# Patient Record
Sex: Male | Born: 1995 | Race: White | Hispanic: No | Marital: Single | Smoking: Never smoker
Health system: Southern US, Community
[De-identification: ages and names within clinical notes are randomized; demographics above are authoritative.]

---
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
## 2014-12-28 ENCOUNTER — Emergency Department: Payer: Medicaid Other

## 2014-12-28 ENCOUNTER — Encounter: Payer: Self-pay | Admitting: Emergency Medicine

## 2014-12-28 ENCOUNTER — Emergency Department
Admission: EM | Admit: 2014-12-28 | Discharge: 2014-12-28 | Disposition: A | Discharge status: Home / Self Care | Payer: Self-pay | Attending: Emergency Medicine | Admitting: Emergency Medicine

## 2014-12-28 DIAGNOSIS — T22011A Burn of unspecified degree of right forearm, initial encounter: Secondary | ICD-10-CM | POA: Insufficient documentation

## 2014-12-28 DIAGNOSIS — S299XXA Unspecified injury of thorax, initial encounter: Secondary | ICD-10-CM | POA: Insufficient documentation

## 2014-12-28 DIAGNOSIS — Y998 Other external cause status: Secondary | ICD-10-CM | POA: Insufficient documentation

## 2014-12-28 DIAGNOSIS — T22012A Burn of unspecified degree of left forearm, initial encounter: Secondary | ICD-10-CM | POA: Insufficient documentation

## 2014-12-28 DIAGNOSIS — Y92410 Unspecified street and highway as the place of occurrence of the external cause: Secondary | ICD-10-CM | POA: Insufficient documentation

## 2014-12-28 DIAGNOSIS — Y9389 Activity, other specified: Secondary | ICD-10-CM | POA: Insufficient documentation

## 2014-12-28 MED ORDER — IBUPROFEN 800 MG PO TABS: 800.0000 mg | ORAL_TABLET | Freq: Three times a day (TID) | ORAL | Status: DC | PRN

## 2014-12-28 MED ORDER — BACLOFEN 10 MG PO TABS
10.0000 mg | ORAL_TABLET | Freq: Three times a day (TID) | ORAL | Status: DC
Start: 2014-12-28 — End: 2016-02-08
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
## 2014-12-28 NOTE — Discharge Instructions (Signed)

## 2016-01-24 ENCOUNTER — Emergency Department: Payer: Medicaid Other

## 2016-01-24 ENCOUNTER — Encounter: Payer: Self-pay | Admitting: Emergency Medicine

## 2016-01-24 DIAGNOSIS — Z79899 Other long term (current) drug therapy: Secondary | ICD-10-CM | POA: Insufficient documentation

## 2016-01-24 DIAGNOSIS — K219 Gastro-esophageal reflux disease without esophagitis: Secondary | ICD-10-CM | POA: Insufficient documentation

## 2016-01-24 LAB — CBC
HEMATOCRIT: 43.1 % (ref 40.0–52.0)
Hemoglobin: 14.8 g/dL (ref 13.0–18.0)
MCH: 28.9 pg (ref 26.0–34.0)
MCHC: 34.4 g/dL (ref 32.0–36.0)
MCV: 84.2 fL (ref 80.0–100.0)
PLATELETS: 190 10*3/uL (ref 150–440)
RBC: 5.12 MIL/uL (ref 4.40–5.90)
RDW: 13.1 % (ref 11.5–14.5)
WBC: 13.3 10*3/uL — AB (ref 3.8–10.6)

## 2016-01-24 LAB — BASIC METABOLIC PANEL
Anion gap: 9 (ref 5–15)
BUN: 13 mg/dL (ref 6–20)
CHLORIDE: 106 mmol/L (ref 101–111)
CO2: 24 mmol/L (ref 22–32)
CREATININE: 0.79 mg/dL (ref 0.61–1.24)
Calcium: 9.3 mg/dL (ref 8.9–10.3)
Glucose, Bld: 92 mg/dL (ref 65–99)
POTASSIUM: 3.4 mmol/L — AB (ref 3.5–5.1)
SODIUM: 139 mmol/L (ref 135–145)

## 2016-01-24 LAB — TROPONIN I: Troponin I: <0.03 ng/mL (ref <0.03)

## 2016-01-24 NOTE — ED Triage Notes (Addendum)
Pt to triage by wheelchair due to chest pain that started around 1900, denies other symptoms. Pt reports central chest pain, sts pain feels like "a ton of bricks sitting on chest." Pt denies N/V. Pt currently laughing and talking with triage tech during blood draw.

## 2016-01-25 ENCOUNTER — Emergency Department
Admission: EM | Admit: 2016-01-25 | Discharge: 2016-01-25 | Disposition: A | Discharge status: Home / Self Care | Payer: Medicaid Other | Attending: Emergency Medicine | Admitting: Emergency Medicine

## 2016-01-25 DIAGNOSIS — R079 Chest pain, unspecified: Secondary | ICD-10-CM

## 2016-01-25 DIAGNOSIS — K219 Gastro-esophageal reflux disease without esophagitis: Secondary | ICD-10-CM

## 2016-01-25 MED ORDER — GI COCKTAIL ~~LOC~~
30.0000 mL | ORAL | Status: AC
  Administered 2016-01-25: 30 mL via ORAL
  Filled 2016-01-25: qty 30

## 2016-01-25 MED ORDER — ALUMINUM-MAGNESIUM-SIMETHICONE 200-200-20 MG/5ML PO SUSP: 30.0000 mL | Freq: Three times a day (TID) | ORAL | 0 refills | Status: DC

## 2016-01-25 MED ORDER — FAMOTIDINE 20 MG PO TABS: 20.0000 mg | ORAL_TABLET | Freq: Two times a day (BID) | ORAL | 0 refills | Status: DC

## 2016-01-25 MED ORDER — FAMOTIDINE 20 MG PO TABS
40.0000 mg | ORAL_TABLET | Freq: Once | ORAL | Status: AC
  Administered 2016-01-25: 40 mg via ORAL
  Filled 2016-01-25: qty 2

## 2016-01-25 NOTE — ED Notes (Signed)
Pt. States intermittent chest pain Wednesday, worse at around 7 pm.  Pt. States taking aspirin when first experienced chest pain today, Pt. Unsure dosage.

## 2016-01-25 NOTE — ED Notes (Signed)
Pt. Going home with father. 

## 2016-01-25 NOTE — ED Provider Notes (Signed)
[facility] Emergency Department Provider Note  ____________________________________________  Time seen: Approximately 1:06 AM  I have reviewed the triage vital signs and the nursing notes.   HISTORY  Chief Complaint Chest Pain    HPI Charles Henderson is a 20 y.o. male who complains of chest pain and multiple episodes today. First started around 11 AM, very mild, resolved on its own. Then, after eating 2 slices of [employer] pizza around 6:30 PM, he suddenly had a recurrence of the pain. It is in center of his chest,initially it felt like "I want to punch someone," and now has improved to feeling like "what the frick" according to the patient. This is the best he can describe it.  Nonradiating. Not exertional, not pleuritic. No associated shortness of breath dizziness diaphoresis or vomiting. No other aggravating or alleviating factors. Moderate in intensity and worse, now mild.     History reviewed. No pertinent past medical history.   There are no active problems to display for this patient.    History reviewed. No pertinent surgical history.   Prior to Admission medications   Medication Sig Start Date End Date Taking? Authorizing Provider  aluminum-magnesium hydroxide-simethicone (MAALOX) 200-200-20 MG/5ML SUSP Take 30 mLs by mouth 4 (four) times daily -  before meals and at bedtime. 01/25/16   Sharman CheekPhillip Zeah Germano, MD  baclofen (LIORESAL) 10 MG tablet Take 1 tablet (10 mg total) by mouth 3 (three) times daily. 12/28/14   Charmayne Sheerharles M Beers, PA-C  famotidine (PEPCID) 20 MG tablet Take 1 tablet (20 mg total) by mouth 2 (two) times daily. 01/25/16   Sharman CheekPhillip Shamecca Whitebread, MD  ibuprofen (ADVIL,MOTRIN) 800 MG tablet Take 1 tablet (800 mg total) by mouth every 8 (eight) hours as needed. 12/28/14   Evangeline Dakinharles M Beers, PA-C     Allergies Sulfa antibiotics   History reviewed. No pertinent family history.  Social History Social History  Substance Use Topics  . Smoking  status: Never Smoker  . Smokeless tobacco: Never Used  . Alcohol use Yes    Review of Systems  Constitutional:   No fever or chills.  ENT:   No sore throat. No rhinorrhea. Cardiovascular:   Positive as above chest pain. Respiratory:   No dyspnea or cough. Gastrointestinal:   Negative for abdominal pain, vomiting and diarrhea.  Genitourinary:   Negative for dysuria or difficulty urinating. Musculoskeletal:   Negative for focal pain or swelling Neurological:   Negative for headaches 10-point ROS otherwise negative.  ____________________________________________   PHYSICAL EXAM:  VITAL SIGNS: ED Triage Vitals [01/24/16 2258]  Enc Vitals Group     BP (!) 141/81     Pulse Rate 89     Resp 15     Temp 98 F (36.7 C)     Temp Source Oral     SpO2 100 %     Weight 250 lb (113.4 kg)     Height 5\' 9"  (1.753 m)     Head Circumference      Peak Flow      Pain Score 9     Pain Loc      Pain Edu?      Excl. in GC?     Vital signs reviewed, nursing assessments reviewed.   Constitutional:   Alert and oriented. Well appearing and in no distress. Eyes:   No scleral icterus. No conjunctival pallor. PERRL. EOMI.  No nystagmus. ENT   Head:   Normocephalic and atraumatic.   Nose:   No congestion/rhinnorhea.  No septal hematoma   Mouth/Throat:   MMM, no pharyngeal erythema. No peritonsillar mass.    Neck:   No stridor. No SubQ emphysema. No meningismus. Hematological/Lymphatic/Immunilogical:   No cervical lymphadenopathy. Cardiovascular:   RRR. Symmetric bilateral radial and DP pulses.  No murmurs.  Respiratory:   Normal respiratory effort without tachypnea nor retractions. Breath sounds are clear and equal bilaterally. No wheezes/rales/rhonchi.Positive mild chest wall tenderness over the sternum in the area of indicated pain Gastrointestinal:   Soft and nontender. Non distended. There is no CVA tenderness.  No rebound, rigidity, or guarding. Genitourinary:    deferred Musculoskeletal:   Nontender with normal range of motion in all extremities. No joint effusions.  No lower extremity tenderness.  No edema. Neurologic:   Normal speech and language.  CN 2-10 normal. Motor grossly intact. No gross focal neurologic deficits are appreciated.  Skin:    Skin is warm, dry and intact. No rash noted.  No petechiae, purpura, or bullae.  ____________________________________________    LABS (pertinent positives/negatives) (all labs ordered are listed, but only abnormal results are displayed) Labs Reviewed  BASIC METABOLIC PANEL - Abnormal; Notable for the following:       Result Value   Potassium 3.4 (*)    All other components within normal limits  CBC - Abnormal; Notable for the following:    WBC 13.3 (*)    All other components within normal limits  TROPONIN I   ____________________________________________   EKG  Interpreted by me Normal sinus rhythm rate of 85, normal axis intervals QRS ST segments and T waves.  ____________________________________________    RADIOLOGY  Chest x-ray unremarkable  ____________________________________________   PROCEDURES Procedures  ____________________________________________   INITIAL IMPRESSION / ASSESSMENT AND PLAN / ED COURSE  Pertinent labs & imaging results that were available during my care of the patient were reviewed by me and considered in my medical decision making (see chart for details).  Patient presents with chest pain, very nonspecific and atypical.Considering the patient's symptoms, medical history, and physical examination today, I have low suspicion for cholecystitis or biliary pathology, pancreatitis, perforation or bowel obstruction, hernia, intra-abdominal abscess, AAA or dissection, volvulus or intussusception, mesenteric ischemia, or appendicitis.  By history very likely to be acid reflux, but exam suggests chest wall pain as well. Either way, appears to be a benign  etiology. I'll give a trial of antacids, recommend follow-up with his primary care doctor.  He confirms that he does have a primary care doctor and can call them tomorrow.    Clinical Course    ____________________________________________   FINAL CLINICAL IMPRESSION(S) / ED DIAGNOSES  Final diagnoses:  Nonspecific chest pain  Gastroesophageal reflux disease, esophagitis presence not specified       Portions of this note were generated with dragon dictation software. Dictation errors may occur despite best attempts at proofreading.    Sharman CheekPhillip Vesta Wheeland, MD 01/25/16 0110

## 2016-02-08 ENCOUNTER — Emergency Department
Admission: EM | Admit: 2016-02-08 | Discharge: 2016-02-08 | Disposition: A | Discharge status: Home / Self Care | Payer: Self-pay | Attending: Emergency Medicine | Admitting: Emergency Medicine

## 2016-02-08 ENCOUNTER — Encounter: Payer: Self-pay | Admitting: Emergency Medicine

## 2016-02-08 ENCOUNTER — Emergency Department: Payer: Self-pay

## 2016-02-08 DIAGNOSIS — J069 Acute upper respiratory infection, unspecified: Secondary | ICD-10-CM | POA: Insufficient documentation

## 2016-02-08 DIAGNOSIS — B9789 Other viral agents as the cause of diseases classified elsewhere: Secondary | ICD-10-CM

## 2016-02-08 MED ORDER — PROMETHAZINE-DM 6.25-15 MG/5ML PO SYRP: 5.0000 mL | ORAL_SOLUTION | Freq: Four times a day (QID) | ORAL | 0 refills | Status: DC | PRN

## 2016-02-08 NOTE — ED Triage Notes (Signed)
Patient to ER for c/o sore throat with coughing up blood tinged mucous this am. Patient in no acute distress, color of skin WNL for ethnicity

## 2016-02-08 NOTE — ED Notes (Signed)
See triage note  States he developed sore throat with cough about 3 days .Marland Kitchen. Also has had fever   But noticed some blood tinged sputum after coughing this am

## 2016-02-08 NOTE — ED Provider Notes (Signed)
[facility] Emergency Department Provider Note  ____________________________________________  Time seen: Approximately 11:52 AM  I have reviewed the triage vital signs and the nursing notes.   HISTORY  Chief Complaint Cough and Sore Throat    HPI Charles Henderson is a 20 y.o. male , NAD, presents to the emergency department for evaluation of productive cough with blood-tinged sputum. States he blew his nose at 7:30 this morning and noticed blood in the mucus. Approximately 2 hours ago he had a cough that was productive of mucus that was also blood-tinged. Had sensation of shortness of breath with the cough but that has resolved. Denies any chest pain, wheezing or palpitations or sensation of racing heart. Has had no abdominal pain, nausea or vomiting. Denies headache, visual changes, numbness, weakness, tingling. Denies any injuries or traumas to cause his symptoms. States he quit smoking tobacco products approximately 2 weeks ago and notes he had only been using tobacco products approximately 6 weeks prior to that. Was seen by his primary care provider yesterday for sore throat and had negative strep testing and diagnosed with viral syndrome. States he continues to have throat irritation but has been taking over-the-counter TheraFlu and Tylenol cold medication which seemed to help for short periods of time. Has had no difficulty swallowing, eating or drinking. Last dose was earlier this morning.    History reviewed. No pertinent past medical history.  There are no active problems to display for this patient.   History reviewed. No pertinent surgical history.  Prior to Admission medications   Medication Sig Start Date End Date Taking? Authorizing Provider  promethazine-dextromethorphan (PROMETHAZINE-DM) 6.25-15 MG/5ML syrup Take 5 mLs by mouth 4 (four) times daily as needed for cough. 02/08/16   Jami L Hagler, PA-C    Allergies Sulfa antibiotics  No family  history on file.  Social History Social History  Substance Use Topics  . Smoking status: Never Smoker  . Smokeless tobacco: Never Used  . Alcohol use Yes     Review of Systems  Constitutional: No fever/chills Eyes: No visual changes. No discharge ENT: Positive sore throat, nasal congestion. No ear pain, sinus pressure. Cardiovascular: No chest pain, Palpitations or racing heart. Respiratory: Positive productive cough with sensation of shortness of breath with cough only. No wheezing.  Gastrointestinal: No abdominal pain.  No nausea, vomiting.   Musculoskeletal: Negative for general myalgias.  Skin: Negative for rash. Neurological: Negative for headaches, focal weakness or numbness. He tingling. 10-point ROS otherwise negative.  ____________________________________________   PHYSICAL EXAM:  VITAL SIGNS: ED Triage Vitals [02/08/16 1121]  Enc Vitals Group     BP (!) 161/85     Pulse Rate 93     Resp 20     Temp 98.3 F (36.8 C)     Temp Source Oral     SpO2 97 %     Weight      Height      Head Circumference      Peak Flow      Pain Score      Pain Loc      Pain Edu?      Excl. in GC?      Constitutional: Alert and oriented. Well appearing and in no acute distress. Eyes: Conjunctivae are normal Without icterus, injection or discharge. Head: Atraumatic. ENT:      Ears: TMs visualized bilaterally without effusion, erythema, bulging or perforation.      Nose: Mild bilateral congestion with clear rhinorrhea and turbinates  are injected. No epistaxis.      Mouth/Throat: Mucous membranes are moist. Pharynx with mild injection but no swelling or exudates. Uvula is midline. Airway is patent. Clear postnasal drip. Neck: No stridor. Supple with full range of motion. Hematological/Lymphatic/Immunilogical: No cervical lymphadenopathy. Cardiovascular: Normal rate, regular rhythm. Normal S1 and S2. No murmurs, rubs, gallops. Good peripheral circulation. Respiratory: Normal  respiratory effort without tachypnea or retractions. Lungs CTAB With breath sounds noted in all lung fields. No wheeze, rhonchi, rales. Musculoskeletal: No lower extremity tenderness nor edema.  No joint effusions. Full range of motion of bilateral upper and lower extremities without pain or difficulty. Neurologic:  Normal speech and language. Normal gait and posture. No gross focal neurologic deficits are appreciated.  Skin:  Skin is warm, dry and intact. No rash noted. Psychiatric: Mood and affect are normal. Speech and behavior are normal. Patient exhibits appropriate insight and judgement.   ____________________________________________   LABS  None ____________________________________________  EKG  None ____________________________________________  RADIOLOGY I, Hope PigeonJami L Hagler, personally viewed and evaluated these images (plain radiographs) as part of my medical decision making, as well as reviewing the written report by the radiologist.  Dg Chest 2 View  Result Date: 02/08/2016 CLINICAL DATA:  Patient reports productive cough with clear sputum and fever x2-3 days. Onset of blood in sputum today. No known heart or lung conditions. Former smoker. EXAM: CHEST  2 VIEW COMPARISON:  01/24/2016 FINDINGS: Midline trachea.  Normal heart size and mediastinal contours. Sharp costophrenic angles.  No pneumothorax.  Clear lungs. IMPRESSION: No active cardiopulmonary disease. Electronically Signed   By: Jeronimo GreavesKyle  Talbot M.D.   On: 02/08/2016 12:46    ____________________________________________    PROCEDURES  Procedure(s) performed: None   Procedures   Medications - No data to display   ____________________________________________   INITIAL IMPRESSION / ASSESSMENT AND PLAN / ED COURSE  Pertinent labs & imaging results that were available during my care of the patient were reviewed by me and considered in my medical decision making (see chart for details).  Clinical Course      Patient's diagnosis is consistent with Viral URI with cough. Episode of blood-tinged sputum was more than likely sputum came from the sinus tract. Patient is well-appearing without tachypnea or tachycardia. No overt hemoptysis has occurred and patient has had no cough, sputum production, epistaxis or hemoptysis while being in the emergency department. He's had no chest pain, shortness of breath during his ED course. Chest x-ray is without abnormality. Patient will be discharged home with prescriptions for Promethazine DM to take as directed. May continue over-the-counter Tylenol or ibuprofen as needed. Patient encouraged to complete warm salt water gargles as often as needed. Patient is to follow up with his primary care provider if symptoms persist past this treatment course. Patient is given ED precautions to return to the ED for any worsening or new symptoms.    ____________________________________________  FINAL CLINICAL IMPRESSION(S) / ED DIAGNOSES  Final diagnoses:  Viral URI with cough      NEW MEDICATIONS STARTED DURING THIS VISIT:  New Prescriptions   PROMETHAZINE-DEXTROMETHORPHAN (PROMETHAZINE-DM) 6.25-15 MG/5ML SYRUP    Take 5 mLs by mouth 4 (four) times daily as needed for cough.         Hope PigeonJami L Hagler, PA-C 02/08/16 1307    Nita Sicklearolina Veronese, MD 02/09/16 [phone]

## 2017-04-17 ENCOUNTER — Emergency Department: Payer: BLUE CROSS/BLUE SHIELD

## 2017-04-17 ENCOUNTER — Emergency Department
Admission: EM | Admit: 2017-04-17 | Discharge: 2017-04-17 | Disposition: A | Discharge status: Home / Self Care | Payer: BLUE CROSS/BLUE SHIELD | Attending: Student in an Organized Health Care Education/Training Program | Admitting: Student in an Organized Health Care Education/Training Program

## 2017-04-17 ENCOUNTER — Encounter: Payer: Self-pay | Admitting: Emergency Medicine

## 2017-04-17 DIAGNOSIS — G5601 Carpal tunnel syndrome, right upper limb: Secondary | ICD-10-CM | POA: Diagnosis not present

## 2017-04-17 DIAGNOSIS — M25532 Pain in left wrist: Secondary | ICD-10-CM

## 2017-04-17 MED ORDER — MELOXICAM 15 MG PO TABS: 15.0000 mg | ORAL_TABLET | Freq: Every day | ORAL | 1 refills | Status: AC

## 2017-04-17 NOTE — ED Notes (Signed)
See triage note  Presents with pain to left wrist  States he injured it on Tuesday  conts to have pain  No deformity noted  Good pulses

## 2017-04-17 NOTE — ED Provider Notes (Signed)
[facility] Emergency Department Provider Note  ____________________________________________  Time seen: Approximately 7:42 PM  I have reviewed the triage vital signs and the nursing notes.   HISTORY  Chief Complaint Wrist Pain    HPI Charles Henderson is a 22 y.o. male presents to the emergency department with left medial wrist pain, which is 4 out of 10 in intensity.  Patient reports that he has to engage in repetitive tasks at work.  Patient reports that he sometimes experiences numbness and tingling.  He denies falls or mechanisms of trauma.  He denies weakness.  He denies a history of de Quervain's tenosynovitis or carpal tunnel.   History reviewed. No pertinent past medical history.  There are no active problems to display for this patient.   History reviewed. No pertinent surgical history.  Prior to Admission medications   Medication Sig Start Date End Date Taking? Authorizing Provider  meloxicam (MOBIC) 15 MG tablet Take 1 tablet (15 mg total) by mouth daily for 7 days. 04/17/17 04/24/17  Orvil FeilWoods, Nyheim Seufert M, PA-C  promethazine-dextromethorphan (PROMETHAZINE-DM) 6.25-15 MG/5ML syrup Take 5 mLs by mouth 4 (four) times daily as needed for cough. 02/08/16   Hagler, Jami L, PA-C    Allergies Sulfa antibiotics  No family history on file.  Social History Social History   Tobacco Use  . Smoking status: Never Smoker  . Smokeless tobacco: Never Used  Substance Use Topics  . Alcohol use: Yes  . Drug use: No     Review of Systems  Constitutional: No fever/chills Eyes: No visual changes. No discharge ENT: No upper respiratory complaints. Cardiovascular: no chest pain. Respiratory: no cough. No SOB. Musculoskeletal: Patient has left wrist pain.  Skin: Negative for rash, abrasions, lacerations, ecchymosis. Neurological: Negative for headaches, focal weakness or numbness.  ____________________________________________   PHYSICAL EXAM:  VITAL  SIGNS: ED Triage Vitals  Enc Vitals Group     BP 04/17/17 1746 (!) 148/72     Pulse Rate 04/17/17 1746 81     Resp 04/17/17 1746 17     Temp 04/17/17 1746 98.6 F (37 C)     Temp Source 04/17/17 1746 Oral     SpO2 04/17/17 1746 99 %     Weight 04/17/17 1745 225 lb (102.1 kg)     Height 04/17/17 1745 5\' 10"  (1.778 m)     Head Circumference --      Peak Flow --      Pain Score 04/17/17 1749 10     Pain Loc --      Pain Edu? --      Excl. in GC? --      Constitutional: Alert and oriented. Well appearing and in no acute distress. Eyes: Conjunctivae are normal. PERRL. EOMI. Head: Atraumatic. Cardiovascular: Normal rate, regular rhythm. Normal S1 and S2.  Good peripheral circulation. Respiratory: Normal respiratory effort without tachypnea or retractions. Lungs CTAB. Good air entry to the bases with no decreased or absent breath sounds. Musculoskeletal: Patient is able to perform full range of motion at the left wrist.  Positive Tinel and Phalen.  Negative Finkelstein test.  Palpable radial pulse, left. Neurologic:  Normal speech and language. No gross focal neurologic deficits are appreciated.  Skin:  Skin is warm, dry and intact. No rash noted.  ____________________________________________   LABS (all labs ordered are listed, but only abnormal results are displayed)  Labs Reviewed - No data to display ____________________________________________  EKG   ____________________________________________  RADIOLOGY Geraldo PitterI, Aman Batley M Chesnee Floren,  personally viewed and evaluated these images (plain radiographs) as part of my medical decision making, as well as reviewing the written report by the radiologist.  Dg Wrist Complete Left  Result Date: 04/17/2017 CLINICAL DATA:  Wrist pain EXAM: LEFT WRIST - COMPLETE 3+ VIEW COMPARISON:  12/28/2006 FINDINGS: There is no evidence of fracture or dislocation. There is no evidence of arthropathy or other focal bone abnormality. Soft tissues are  unremarkable. IMPRESSION: Negative. Electronically Signed   By: Jasmine Pang M.D.   On: 04/17/2017 18:27    ____________________________________________    PROCEDURES  Procedure(s) performed:    Procedures    Medications - No data to display   ____________________________________________   INITIAL IMPRESSION / ASSESSMENT AND PLAN / ED COURSE  Pertinent labs & imaging results that were available during my care of the patient were reviewed by me and considered in my medical decision making (see chart for details).  Review of the [place] CSRS was performed in accordance of the [facility] prior to dispensing any controlled drugs.     Assessment and plan Carpal tunnel Patient presents to the emergency department with left wrist pain.  Differential diagnosis included carpal tunnel, wrist sprain and de Quervain's tenosynovitis.  History and physical exam findings are consistent with carpal tunnel at this time.  Patient was placed in a wrist splint and advised to use wrist splint at night.  He was also started on daily meloxicam orthopedics.  All patient questions were answered.      ____________________________________________  FINAL CLINICAL IMPRESSION(S) / ED DIAGNOSES  Final diagnoses:  Left wrist pain      NEW MEDICATIONS STARTED DURING THIS VISIT:  ED Discharge Orders        Ordered    meloxicam (MOBIC) 15 MG tablet  Daily     04/17/17 1839          This chart was dictated using voice recognition software/Dragon. Despite best efforts to proofread, errors can occur which can change the meaning. Any change was purely unintentional.    Orvil Feil, PA-C 04/17/17 1946    Willy Eddy, MD 04/17/17 2242

## 2017-04-17 NOTE — ED Triage Notes (Signed)
Pt arrived with complaints of left wrist pain. Pt states he injured in Tuesday. Pt's pulse palpable and strong.

## 2018-04-18 IMAGING — CR DG CHEST 2V
2 series · 2 of 2 positions shown · non-contrast
Comparison: 12/28/2014

CLINICAL DATA: Central chest pain.

EXAM:
CHEST  2 VIEW

[chest pa]
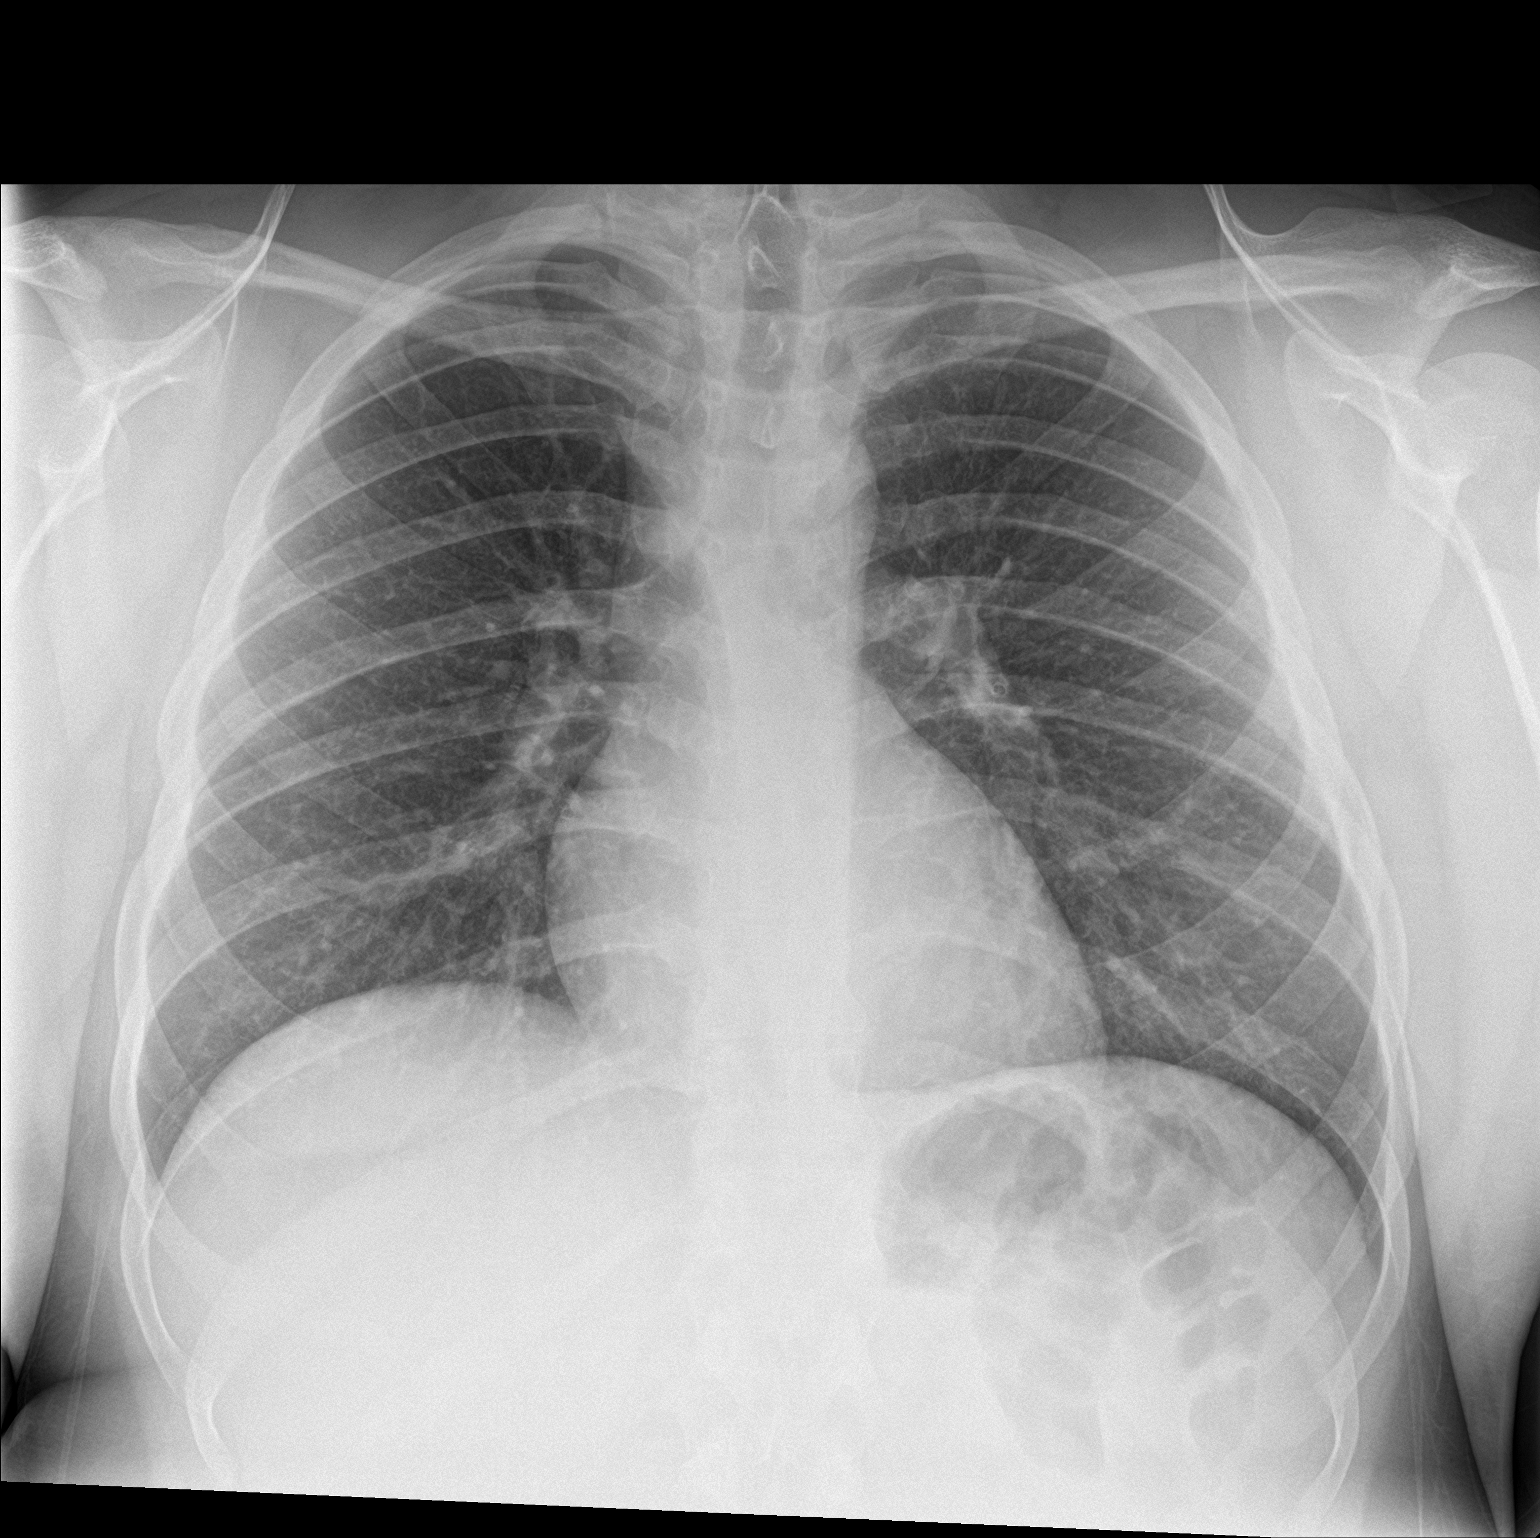

[chest lat]
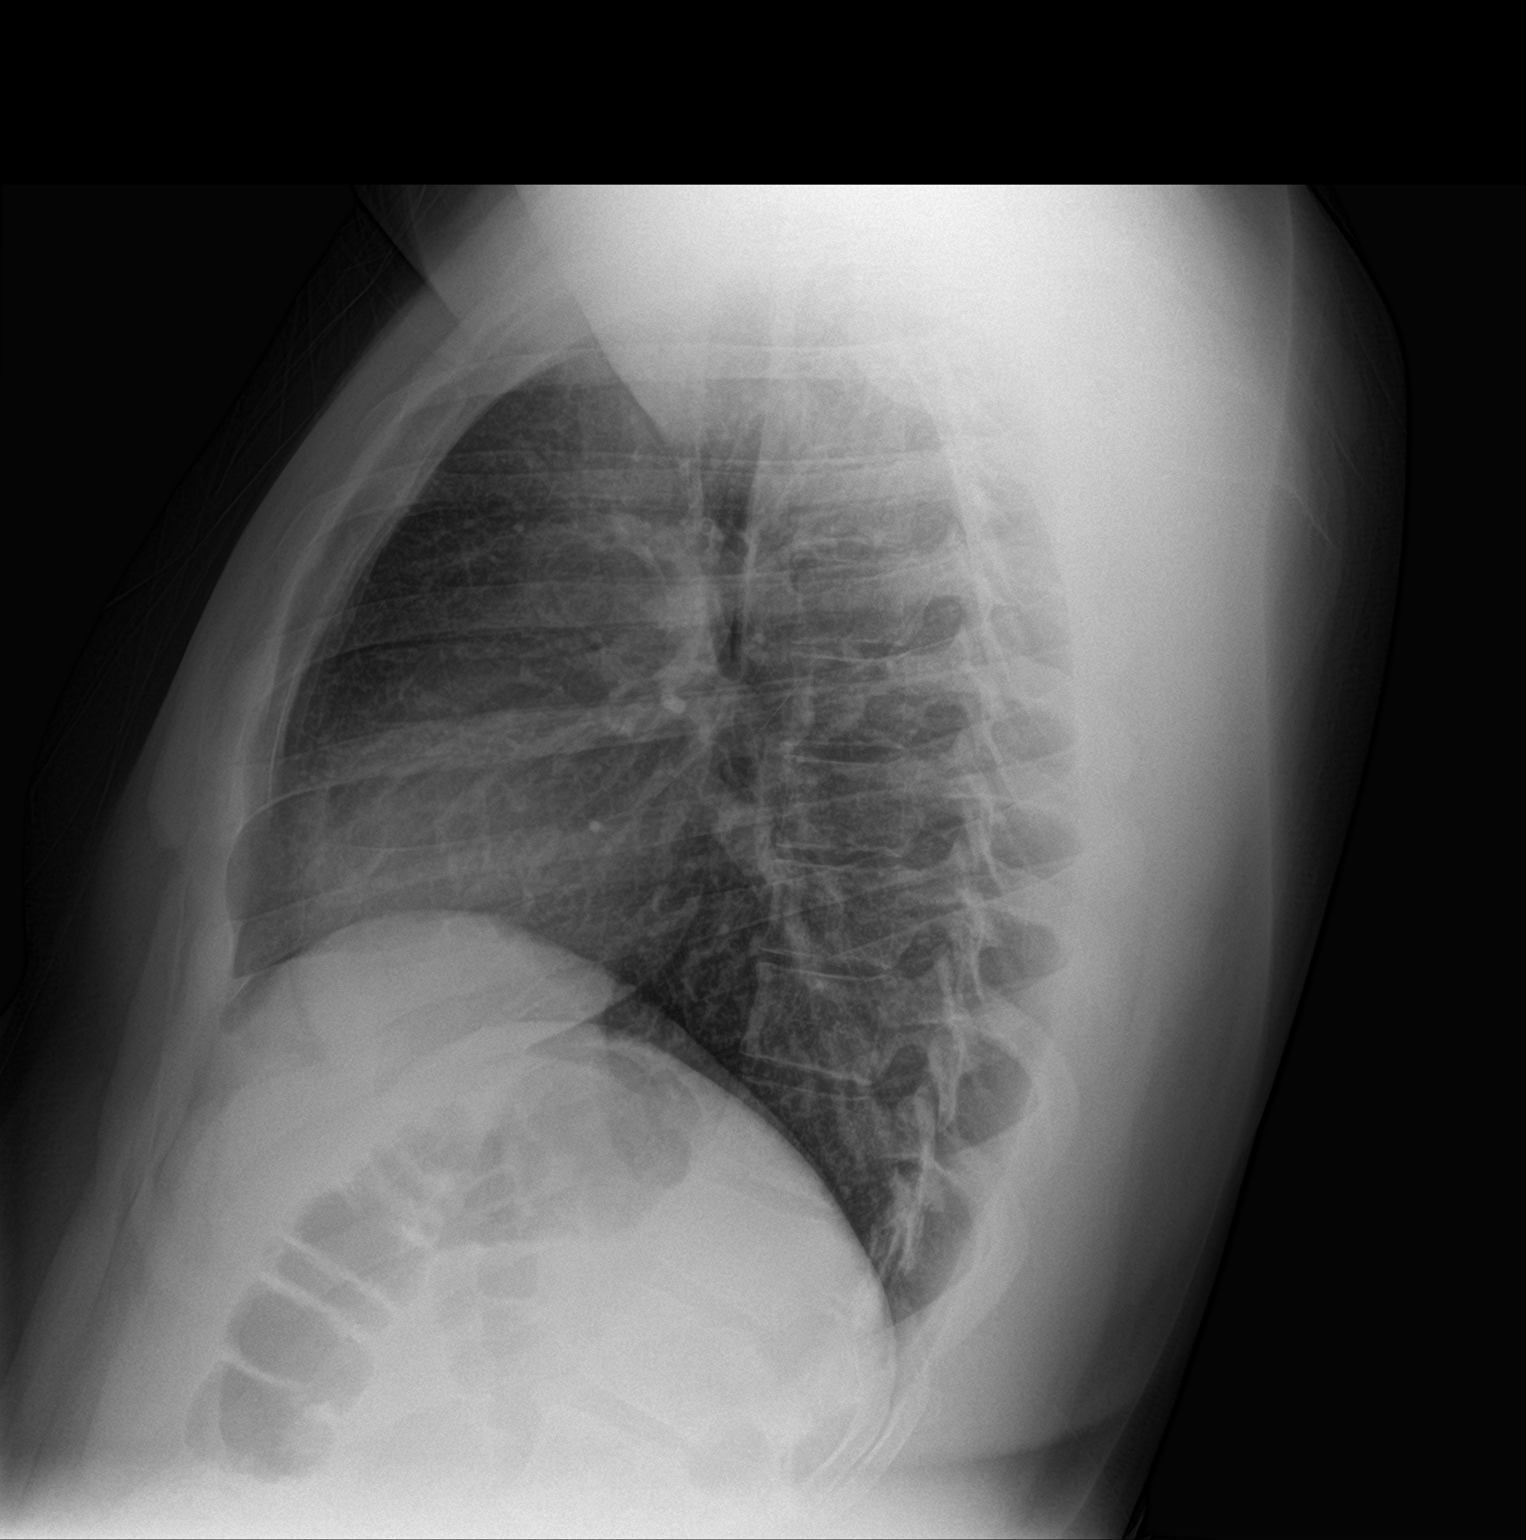

[2 of 2 positions shown; findings below may reference images not displayed]

FINDINGS: The cardiomediastinal contours are normal. The lungs are clear.
Pulmonary vasculature is normal. No consolidation, pleural effusion,
or pneumothorax. No acute osseous abnormalities are seen.
IMPRESSION: No acute pulmonary process.

## 2019-03-29 ENCOUNTER — Other Ambulatory Visit: Payer: Self-pay

## 2019-03-29 ENCOUNTER — Emergency Department
Admission: EM | Admit: 2019-03-29 | Discharge: 2019-03-29 | Disposition: A | Discharge status: Home / Self Care | Payer: BLUE CROSS/BLUE SHIELD | Attending: Emergency Medicine | Admitting: Emergency Medicine

## 2019-03-29 ENCOUNTER — Encounter: Payer: Self-pay | Admitting: Emergency Medicine

## 2019-03-29 DIAGNOSIS — L0231 Cutaneous abscess of buttock: Secondary | ICD-10-CM | POA: Insufficient documentation

## 2019-03-29 DIAGNOSIS — L0291 Cutaneous abscess, unspecified: Secondary | ICD-10-CM

## 2019-03-29 MED ORDER — CLINDAMYCIN HCL 150 MG PO CAPS
300.0000 mg | ORAL_CAPSULE | Freq: Once | ORAL | Status: AC
  Administered 2019-03-29: 300 mg via ORAL
  Filled 2019-03-29: qty 2

## 2019-03-29 MED ORDER — CLINDAMYCIN HCL 300 MG PO CAPS: 300.0000 mg | ORAL_CAPSULE | Freq: Three times a day (TID) | ORAL | 0 refills | Status: AC

## 2019-03-29 NOTE — ED Provider Notes (Signed)
[facility] Emergency Department Provider Note  ____________________________________________  Time seen: Approximately 11:52 AM  I have reviewed the triage vital signs and the nursing notes.   HISTORY  Chief Complaint Abscess    HPI Charles Henderson is a 24 y.o. male that presents to the emergency department for evaluation of abscess to buttocks for several days.  Abscess is painful when he sits on it.  Abscess started draining when he arrived in the emergency department.  He has no past medical conditions.  No fevers.   History reviewed. No pertinent past medical history.  There are no problems to display for this patient.   History reviewed. No pertinent surgical history.  Prior to Admission medications   Medication Sig Start Date End Date Taking? Authorizing Provider  clindamycin (CLEOCIN) 300 MG capsule Take 1 capsule (300 mg total) by mouth 3 (three) times daily for 10 days. 03/29/19 04/08/19  Enid Derry, PA-C    Allergies Sulfa antibiotics  History reviewed. No pertinent family history.  Social History Social History   Tobacco Use  . Smoking status: Never Smoker  . Smokeless tobacco: Never Used  Substance Use Topics  . Alcohol use: Yes  . Drug use: No     Review of Systems  Constitutional: No fever/chills Respiratory: No SOB. Gastrointestinal:  No nausea, no vomiting.  Musculoskeletal: Negative for musculoskeletal pain. Skin: Negative for abrasions, lacerations, ecchymosis.  Positive for abscess. Neurological: Negative for headaches, numbness or tingling   ____________________________________________   PHYSICAL EXAM:  VITAL SIGNS: ED Triage Vitals  Enc Vitals Group     BP 03/29/19 1109 (!) 164/94     Pulse Rate 03/29/19 1109 (!) 106     Resp 03/29/19 1109 18     Temp 03/29/19 1109 98.4 F (36.9 C)     Temp Source 03/29/19 1109 Oral     SpO2 03/29/19 1109 100 %     Weight 03/29/19 1106 275 lb (124.7 kg)     Height  03/29/19 1106 5\' 9"  (1.753 m)     Head Circumference --      Peak Flow --      Pain Score 03/29/19 1106 8     Pain Loc --      Pain Edu? --      Excl. in GC? --      Constitutional: Alert and oriented. Well appearing and in no acute distress. Eyes: Conjunctivae are normal. PERRL. EOMI. Head: Atraumatic. ENT:      Ears:      Nose: No congestion/rhinnorhea.      Mouth/Throat: Mucous membranes are moist.  Neck: No stridor.   Cardiovascular: Normal rate, regular rhythm.  Good peripheral circulation. Respiratory: Normal respiratory effort without tachypnea or retractions. Lungs CTAB. Good air entry to the bases with no decreased or absent breath sounds. Musculoskeletal: Full range of motion to all extremities. No gross deformities appreciated. Neurologic:  Normal speech and language. No gross focal neurologic deficits are appreciated.  Skin:  Skin is warm, dry and intact.  1 cm x 1 cm area of erythema to left medial buttocks that has active drainage from the center. Psychiatric: Mood and affect are normal. Speech and behavior are normal. Patient exhibits appropriate insight and judgement.   ____________________________________________   LABS (all labs ordered are listed, but only abnormal results are displayed)  Labs Reviewed - No data to display ____________________________________________  EKG   ____________________________________________  RADIOLOGY   No results found.  ____________________________________________    PROCEDURES  Procedure(s) performed:    Procedures    Medications  clindamycin (CLEOCIN) capsule 300 mg (300 mg Oral Given 03/29/19 1303)     ____________________________________________   INITIAL IMPRESSION / ASSESSMENT AND PLAN / ED COURSE  Pertinent labs & imaging results that were available during my care of the patient were reviewed by me and considered in my medical decision making (see chart for details).  Review of the [place] CSRS was  performed in accordance of the [place] prior to dispensing any controlled drugs.   Patient's diagnosis is consistent with abscess. Abscess is actively draining. Patient will be discharged home with prescriptions for Clindamycin. He is allergic to bactrim. Patient is to follow up with primary care as directed. Patient is given ED precautions to return to the ED for any worsening or new symptoms.   Charles Henderson was evaluated in Emergency Department on 03/29/2019 for the symptoms described in the history of present illness. He was evaluated in the context of the global COVID-19 pandemic, which necessitated consideration that the patient might be at risk for infection with the SARS-CoV-2 virus that causes COVID-19. Institutional protocols and algorithms that pertain to the evaluation of patients at risk for COVID-19 are in a state of rapid change based on information released by regulatory bodies including the CDC and federal and state organizations. These policies and algorithms were followed during the patient's care in the ED.  ____________________________________________  FINAL CLINICAL IMPRESSION(S) / ED DIAGNOSES  Final diagnoses:  Abscess      NEW MEDICATIONS STARTED DURING THIS VISIT:  ED Discharge Orders         Ordered    clindamycin (CLEOCIN) 300 MG capsule  3 times daily     03/29/19 1257              This chart was dictated using voice recognition software/Dragon. Despite best efforts to proofread, errors can occur which can change the meaning. Any change was purely unintentional.    Laban Emperor, PA-C 03/29/19 1745    [name], MD 03/30/19 [phone]

## 2019-03-29 NOTE — ED Triage Notes (Signed)
Pt describes abscess like area to perineum. Describes it between "balls and butt hole".  Pt reports it as size of nickel.  No fever. No drainage.

## 2019-03-29 NOTE — ED Notes (Signed)
See triage note  Presents with possible abscess area to buttocks  States he noticed the area on Thursday  Became larger over the weekend  On arrival to ED the area busted

## 2019-12-07 ENCOUNTER — Other Ambulatory Visit: Payer: Self-pay

## 2019-12-07 ENCOUNTER — Emergency Department
Admission: EM | Admit: 2019-12-07 | Discharge: 2019-12-07 | Disposition: A | Discharge status: Home / Self Care | Payer: Medicaid Other | Attending: Emergency Medicine | Admitting: Emergency Medicine

## 2019-12-07 ENCOUNTER — Ambulatory Visit
Admission: EM | Admit: 2019-12-07 | Discharge: 2019-12-07 | Disposition: A | Discharge status: Home / Self Care | Payer: Medicaid Other

## 2019-12-07 ENCOUNTER — Encounter: Payer: Self-pay | Admitting: Emergency Medicine

## 2019-12-07 ENCOUNTER — Emergency Department: Payer: Medicaid Other

## 2019-12-07 DIAGNOSIS — Z20822 Contact with and (suspected) exposure to covid-19: Secondary | ICD-10-CM | POA: Insufficient documentation

## 2019-12-07 DIAGNOSIS — R072 Precordial pain: Secondary | ICD-10-CM | POA: Insufficient documentation

## 2019-12-07 DIAGNOSIS — J029 Acute pharyngitis, unspecified: Secondary | ICD-10-CM | POA: Insufficient documentation

## 2019-12-07 DIAGNOSIS — E876 Hypokalemia: Secondary | ICD-10-CM | POA: Insufficient documentation

## 2019-12-07 DIAGNOSIS — R059 Cough, unspecified: Secondary | ICD-10-CM

## 2019-12-07 DIAGNOSIS — R0602 Shortness of breath: Secondary | ICD-10-CM

## 2019-12-07 DIAGNOSIS — R079 Chest pain, unspecified: Secondary | ICD-10-CM

## 2019-12-07 LAB — BASIC METABOLIC PANEL
Anion gap: 9 (ref 5–15)
BUN: 9 mg/dL (ref 6–20)
CO2: 25 mmol/L (ref 22–32)
Calcium: 8.9 mg/dL (ref 8.9–10.3)
Chloride: 106 mmol/L (ref 98–111)
Creatinine, Ser: 0.91 mg/dL (ref 0.61–1.24)
GFR, Estimated: >60 mL/min (ref >60)
Glucose, Bld: 97 mg/dL (ref 70–99)
Potassium: 3.2 mmol/L — ABNORMAL LOW (ref 3.5–5.1)
Sodium: 140 mmol/L (ref 135–145)

## 2019-12-07 LAB — CBC
HCT: 44.9 % (ref 39.0–52.0)
Hemoglobin: 15.1 g/dL (ref 13.0–17.0)
MCH: 28.6 pg (ref 26.0–34.0)
MCHC: 33.6 g/dL (ref 30.0–36.0)
MCV: 85 fL (ref 80.0–100.0)
Platelets: 216 10*3/uL (ref 150–400)
RBC: 5.28 MIL/uL (ref 4.22–5.81)
RDW: 12.6 % (ref 11.5–15.5)
WBC: 11.3 10*3/uL — ABNORMAL HIGH (ref 4.0–10.5)
nRBC: 0 % (ref 0.0–0.2)

## 2019-12-07 LAB — TROPONIN I (HIGH SENSITIVITY)
Troponin I (High Sensitivity): 3 ng/L (ref <18)
Troponin I (High Sensitivity): <2 ng/L (ref <18)

## 2019-12-07 LAB — FIBRIN DERIVATIVES D-DIMER (ARMC ONLY): Fibrin derivatives D-dimer (ARMC): 115.01 ng/mL (FEU) (ref 0.00–499.00)

## 2019-12-07 LAB — RESPIRATORY PANEL BY RT PCR (FLU A&B, COVID)
Influenza A by PCR: NEGATIVE
Influenza B by PCR: NEGATIVE
SARS Coronavirus 2 by RT PCR: NEGATIVE

## 2019-12-07 LAB — GROUP A STREP BY PCR: Group A Strep by PCR: NOT DETECTED

## 2019-12-07 MED ORDER — POTASSIUM CHLORIDE CRYS ER 20 MEQ PO TBCR
40.0000 meq | EXTENDED_RELEASE_TABLET | Freq: Once | ORAL | Status: AC
  Administered 2019-12-07: 40 meq via ORAL
  Filled 2019-12-07: qty 2

## 2019-12-07 MED ORDER — IBUPROFEN 400 MG PO TABS
400.0000 mg | ORAL_TABLET | Freq: Once | ORAL | Status: AC
  Administered 2019-12-07: 400 mg via ORAL
  Filled 2019-12-07: qty 1

## 2019-12-07 NOTE — ED Provider Notes (Signed)
[facility] Emergency Department Provider Note  ____________________________________________   First MD Initiated Contact with Patient 12/07/19 1601     (approximate)  I have reviewed the triage vital signs and the nursing notes.   HISTORY  Chief Complaint Chest Pain and Shortness of Breath   HPI Charles Henderson is a 24 y.o. male without significant past medical history who presents for assessment of some substernal chest pain that started last night and radiate to his left shoulder.  It was associate with some shortness of breath.  Patient also states he has had some cough, sore throat, and congestion over the last several days as well.  States he had a little bit of blood mixed with his sputum yesterday but no other significant hemoptysis.  He states his chest pain has gotten a little better and his shortness of breath is much better than it was last night.  No prior synopsis.  No clear alleviating aggravating factors.  Patient denies any headache, earache, vision changes, abdominal pain, vomiting, diarrhea, dysuria, rash, extremity pain, or other acute complaints.  Denies tobacco abuse, EtOH abuse, or illicit drug use.  Denies any known history of CAD, PE/DVT.         History reviewed. No pertinent past medical history.  There are no problems to display for this patient.   History reviewed. No pertinent surgical history.  Prior to Admission medications   Not on File    Allergies Sulfa antibiotics  History reviewed. No pertinent family history.  Social History Social History   Tobacco Use  . Smoking status: Never Smoker  . Smokeless tobacco: Never Used  Substance Use Topics  . Alcohol use: Yes  . Drug use: No    Review of Systems  Review of Systems  Constitutional: Negative for chills and fever.  HENT: Negative for sore throat.   Eyes: Negative for pain.  Respiratory: Negative for cough and stridor.   Cardiovascular: Positive for  chest pain.  Gastrointestinal: Negative for vomiting.  Skin: Negative for rash.  Neurological: Negative for seizures, loss of consciousness and headaches.  Psychiatric/Behavioral: Negative for suicidal ideas.  All other systems reviewed and are negative.     ____________________________________________   PHYSICAL EXAM:  VITAL SIGNS: ED Triage Vitals  Enc Vitals Group     BP 12/07/19 1410 131/79     Pulse Rate 12/07/19 1410 81     Resp 12/07/19 1410 18     Temp 12/07/19 1410 98.4 F (36.9 C)     Temp Source 12/07/19 1410 Oral     SpO2 12/07/19 1410 98 %     Weight 12/07/19 1407 263 lb (119.3 kg)     Height 12/07/19 1407 5\' 9"  (1.753 m)     Head Circumference --      Peak Flow --      Pain Score 12/07/19 1407 8     Pain Loc --      Pain Edu? --      Excl. in GC? --    Vitals:   12/07/19 1410  BP: 131/79  Pulse: 81  Resp: 18  Temp: 98.4 F (36.9 C)  SpO2: 98%   Physical Exam Vitals and nursing note reviewed.  Constitutional:      Appearance: He is well-developed.  HENT:     Head: Normocephalic and atraumatic.     Right Ear: External ear normal.     Left Ear: External ear normal.     Nose: Nose normal.  Mouth/Throat:     Mouth: Mucous membranes are moist.  Eyes:     Conjunctiva/sclera: Conjunctivae normal.  Cardiovascular:     Rate and Rhythm: Normal rate and regular rhythm.     Heart sounds: No murmur heard.   Pulmonary:     Effort: Pulmonary effort is normal. No respiratory distress.     Breath sounds: Normal breath sounds.  Abdominal:     Palpations: Abdomen is soft.     Tenderness: There is no abdominal tenderness.  Musculoskeletal:     Cervical back: Neck supple.     Right lower leg: No edema.     Left lower leg: No edema.  Skin:    General: Skin is warm and dry.     Capillary Refill: Capillary refill takes less than 2 seconds.  Neurological:     Mental Status: He is alert and oriented to person, place, and time.  Psychiatric:        Mood  and Affect: Mood normal.     Oropharynx has some posterior erythema but there is no tonsillar exudates, enlargement, or uvular deviation.  Oropharynx is otherwise unremarkable.  Patient is full range of motion of his neck.  Cranial nerves II through XII grossly intact. ____________________________________________   LABS (all labs ordered are listed, but only abnormal results are displayed)  Labs Reviewed  BASIC METABOLIC PANEL - Abnormal; Notable for the following components:      Result Value   Potassium 3.2 (*)    All other components within normal limits  CBC - Abnormal; Notable for the following components:   WBC 11.3 (*)    All other components within normal limits  RESPIRATORY PANEL BY RT PCR (FLU A&B, COVID)  GROUP A STREP BY PCR  FIBRIN DERIVATIVES D-DIMER ([facility] ONLY)  TROPONIN I (HIGH SENSITIVITY)  TROPONIN I (HIGH SENSITIVITY)   ____________________________________________  EKG  Sinus rhythm with a ventricular rate of 81, normal axis, unremarkable intervals, nonspecific ST change in lead III and lateral leads with no clear evidence of acute ischemia. ____________________________________________  RADIOLOGY  ED MD interpretation: No focal consolidation, pneumothorax, effusion, edema, or other acute thoracic process.  Official radiology report(s): DG Chest 2 View  Result Date: 12/07/2019 CLINICAL DATA:  Chest pain, shortness of breath. EXAM: CHEST - 2 VIEW COMPARISON:  02/08/2016 FINDINGS: The heart size and mediastinal contours are within normal limits. Both lungs are clear. No pleural effusions or pneumothorax. The visualized skeletal structures are unremarkable. IMPRESSION: No acute cardiopulmonary disease. Electronically Signed   By: Feliberto Harts MD   On: 12/07/2019 14:35    ____________________________________________   PROCEDURES  Procedure(s) performed (including Critical  Care):  Procedures   ____________________________________________   INITIAL IMPRESSION / ASSESSMENT AND PLAN / ED COURSE        Patient presents for assessment of some substernal chest pain rating to his left shoulder associate with shortness of breath in the setting of several days of cough that had some blood mixed with sputum, sore throat, and congestion.  Patient is afebrile and hemodynamically stable.  Exam as above.  Differential includes but is not limited to ACS, PE, pneumonia, viral bronchitis, and strep pharyngitis.  No findings on chest tightness to suggest pneumothorax and presentation overall not consistent with dissection or GI pathology.  No findings on history or exam to suggest deep space infection in the head or neck.  Low suspicion for ACS given reassuring EKG with 2 nonelevated troponins obtained over 2 hours.  Low suspicion for  PE as D-dimer is less than 500.  Chest x-ray shows no evidence of pneumothorax, consolidative pneumonia, or other acute intrathoracic process.  BMP shows mild hypokalemia without any other significant for metabolic derangements.  CBC is unremarkable.  Strep and Covid are both negative.  Impression is likely viral pharyngitis and bronchitis.  Given stable vital signs with a laceration work-up and exam I believe patient safe for discharge with plan for outpatient follow-up.  Discharge stable condition.  Strict return precautions advised and discussed. ____________________________________________   FINAL CLINICAL IMPRESSION(S) / ED DIAGNOSES  Final diagnoses:  Chest pain, unspecified type  SOB (shortness of breath)  Cough  Pharyngitis, unspecified etiology  Hypokalemia    Medications  potassium chloride SA (KLOR-CON) CR tablet 40 mEq (40 mEq Oral Given 12/07/19 1628)  ibuprofen (ADVIL) tablet 400 mg (400 mg Oral Given 12/07/19 1628)     ED Discharge Orders    None       Note:  This document was prepared using Dragon voice  recognition software and may include unintentional dictation errors.   Gilles Chiquito, MD 12/07/19 [phone]

## 2019-12-07 NOTE — ED Triage Notes (Signed)
Pt presents to ED via POV with c/o substernal CP that radiates to L shoulder, pt states started last night and was intermittent become persistent approx 1-2 hrs ago. Pt also c/o SOB and tingling to to L hand. Pt A&O x4, ambulatory without difficulty at this time.

## 2019-12-16 ENCOUNTER — Other Ambulatory Visit: Payer: Self-pay

## 2019-12-16 ENCOUNTER — Emergency Department: Payer: Medicaid Other

## 2019-12-16 ENCOUNTER — Emergency Department
Admission: EM | Admit: 2019-12-16 | Discharge: 2019-12-16 | Disposition: A | Discharge status: Home / Self Care | Payer: Medicaid Other | Attending: Emergency Medicine | Admitting: Emergency Medicine

## 2019-12-16 DIAGNOSIS — U071 COVID-19: Secondary | ICD-10-CM | POA: Insufficient documentation

## 2019-12-16 DIAGNOSIS — K920 Hematemesis: Secondary | ICD-10-CM | POA: Insufficient documentation

## 2019-12-16 LAB — CBC WITH DIFFERENTIAL/PLATELET
Abs Immature Granulocytes: 0.02 10*3/uL (ref 0.00–0.07)
Basophils Absolute: 0 10*3/uL (ref 0.0–0.1)
Basophils Relative: 1 %
Eosinophils Absolute: 0 10*3/uL (ref 0.0–0.5)
Eosinophils Relative: 0 %
HCT: 42.1 % (ref 39.0–52.0)
Hemoglobin: 14.3 g/dL (ref 13.0–17.0)
Immature Granulocytes: 0 %
Lymphocytes Relative: 12 %
Lymphs Abs: 0.6 10*3/uL — ABNORMAL LOW (ref 0.7–4.0)
MCH: 29.1 pg (ref 26.0–34.0)
MCHC: 34 g/dL (ref 30.0–36.0)
MCV: 85.6 fL (ref 80.0–100.0)
Monocytes Absolute: 0.7 10*3/uL (ref 0.1–1.0)
Monocytes Relative: 14 %
Neutro Abs: 3.4 10*3/uL (ref 1.7–7.7)
Neutrophils Relative %: 73 %
Platelets: 182 10*3/uL (ref 150–400)
RBC: 4.92 MIL/uL (ref 4.22–5.81)
RDW: 12.5 % (ref 11.5–15.5)
WBC: 4.7 10*3/uL (ref 4.0–10.5)
nRBC: 0 % (ref 0.0–0.2)

## 2019-12-16 LAB — COMPREHENSIVE METABOLIC PANEL
ALT: 46 U/L — ABNORMAL HIGH (ref 0–44)
AST: 26 U/L (ref 15–41)
Albumin: 3.9 g/dL (ref 3.5–5.0)
Alkaline Phosphatase: 76 U/L (ref 38–126)
Anion gap: 9 (ref 5–15)
BUN: 9 mg/dL (ref 6–20)
CO2: 24 mmol/L (ref 22–32)
Calcium: 9.2 mg/dL (ref 8.9–10.3)
Chloride: 106 mmol/L (ref 98–111)
Creatinine, Ser: 0.72 mg/dL (ref 0.61–1.24)
GFR, Estimated: >60 mL/min (ref >60)
Glucose, Bld: 109 mg/dL — ABNORMAL HIGH (ref 70–99)
Potassium: 3.5 mmol/L (ref 3.5–5.1)
Sodium: 139 mmol/L (ref 135–145)
Total Bilirubin: 0.8 mg/dL (ref 0.3–1.2)
Total Protein: 7 g/dL (ref 6.5–8.1)

## 2019-12-16 LAB — RESPIRATORY PANEL BY RT PCR (FLU A&B, COVID)
Influenza A by PCR: NEGATIVE
Influenza B by PCR: NEGATIVE
SARS Coronavirus 2 by RT PCR: POSITIVE — AB

## 2019-12-16 LAB — LIPASE, BLOOD: Lipase: 29 U/L (ref 11–51)

## 2019-12-16 MED ORDER — PANTOPRAZOLE SODIUM 40 MG PO TBEC: 40.0000 mg | DELAYED_RELEASE_TABLET | Freq: Every day | ORAL | 0 refills | Status: AC

## 2019-12-16 MED ORDER — ALUM & MAG HYDROXIDE-SIMETH 200-200-20 MG/5ML PO SUSP
30.0000 mL | Freq: Once | ORAL | Status: AC
  Administered 2019-12-16: 30 mL via ORAL
  Filled 2019-12-16: qty 30

## 2019-12-16 MED ORDER — ONDANSETRON HCL 4 MG PO TABS: 4.0000 mg | ORAL_TABLET | Freq: Three times a day (TID) | ORAL | 0 refills | Status: AC | PRN

## 2019-12-16 MED ORDER — LACTATED RINGERS IV BOLUS
1000.0000 mL | Freq: Once | INTRAVENOUS | Status: AC
  Administered 2019-12-16: 1000 mL via INTRAVENOUS

## 2019-12-16 MED ORDER — PANTOPRAZOLE SODIUM 40 MG IV SOLR
40.0000 mg | Freq: Once | INTRAVENOUS | Status: AC
  Administered 2019-12-16: 40 mg via INTRAVENOUS
  Filled 2019-12-16: qty 40

## 2019-12-16 MED ORDER — ONDANSETRON HCL 4 MG/2ML IJ SOLN
4.0000 mg | Freq: Once | INTRAMUSCULAR | Status: AC
  Administered 2019-12-16: 4 mg via INTRAVENOUS
  Filled 2019-12-16: qty 2

## 2019-12-16 NOTE — ED Provider Notes (Signed)
[facility] Emergency Department Provider Note  ____________________________________________   First MD Initiated Contact with Patient 12/16/19 1619     (approximate)  I have reviewed the triage vital signs and the nursing notes.   HISTORY  Chief Complaint Hematemesis   HPI Charles Henderson is a 24 y.o. male without significant past medical history who presents for assessment of vomiting that began today and became bloody this afternoon.  Patient states he was feeling his usual state before beginning have nausea and vomiting around 7 AM today.  States he initially thought he had some food poisoning but stated in the emergency room when latest afternoon he had some bright red blood mixed with his vomiting.  Denies ingesting or drinking any red beverages.  Denies any diarrhea dysuria or blood in his stool or urine.  States he has had a little bit of epigastric discomfort she suspects is from his vomiting but no back pain, cough, shortness of breath, chest pain, headache, earache, sore throat, fevers, or other acute sick symptoms.  No prior synopsis.  No clearly getting aggravating factors aside from vomiting.  Patient does not anticoagulated.  Denies significant NSAID use or EtOH use.         History reviewed. No pertinent past medical history.  There are no problems to display for this patient.   History reviewed. No pertinent surgical history.  Prior to Admission medications   Medication Sig Start Date End Date Taking? Authorizing Provider  ondansetron (ZOFRAN) 4 MG tablet Take 1 tablet (4 mg total) by mouth every 8 (eight) hours as needed for up to 10 doses for nausea or vomiting. 12/16/19   Gilles Chiquito, MD  pantoprazole (PROTONIX) 40 MG tablet Take 1 tablet (40 mg total) by mouth daily for 14 days. 12/16/19 12/30/19  Gilles Chiquito, MD    Allergies Sulfa antibiotics  History reviewed. No pertinent family history.  Social History Social  History   Tobacco Use  . Smoking status: Never Smoker  . Smokeless tobacco: Never Used  Substance Use Topics  . Alcohol use: Yes  . Drug use: No    Review of Systems  Review of Systems  Constitutional: Negative for chills and fever.  HENT: Positive for congestion. Negative for sore throat.   Eyes: Negative for pain.  Respiratory: Negative for cough and stridor.   Cardiovascular: Negative for chest pain.  Gastrointestinal: Positive for abdominal pain ( epigastric), heartburn, nausea and vomiting. Negative for blood in stool, constipation, diarrhea and melena.  Genitourinary: Negative for dysuria.  Musculoskeletal: Negative for myalgias.  Skin: Negative for rash.  Neurological: Negative for seizures, loss of consciousness and headaches.  Psychiatric/Behavioral: Negative for suicidal ideas.  All other systems reviewed and are negative.     ____________________________________________   PHYSICAL EXAM:  VITAL SIGNS: ED Triage Vitals  Enc Vitals Group     BP 12/16/19 1550 (!) 149/87     Pulse Rate 12/16/19 1550 95     Resp 12/16/19 1550 18     Temp 12/16/19 1550 98.9 F (37.2 C)     Temp Source 12/16/19 1550 Oral     SpO2 12/16/19 1550 97 %     Weight 12/16/19 1548 262 lb (118.8 kg)     Height 12/16/19 1548 5\' 9"  (1.753 m)     Head Circumference --      Peak Flow --      Pain Score 12/16/19 1547 0     Pain Loc --  Pain Edu? --      Excl. in GC? --    Vitals:   12/16/19 1550  BP: (!) 149/87  Pulse: 95  Resp: 18  Temp: 98.9 F (37.2 C)  SpO2: 97%   Physical Exam Vitals and nursing note reviewed.  Constitutional:      Appearance: He is well-developed.  HENT:     Head: Normocephalic and atraumatic.     Right Ear: External ear normal.     Left Ear: External ear normal.     Nose: Nose normal.     Mouth/Throat:     Mouth: Mucous membranes are moist.  Eyes:     Conjunctiva/sclera: Conjunctivae normal.  Cardiovascular:     Rate and Rhythm: Normal rate  and regular rhythm.     Pulses: Normal pulses.     Heart sounds: No murmur heard.   Pulmonary:     Effort: Pulmonary effort is normal. No respiratory distress.     Breath sounds: Normal breath sounds.  Abdominal:     Palpations: Abdomen is soft.     Tenderness: There is no abdominal tenderness.  Musculoskeletal:     Cervical back: Neck supple.  Skin:    General: Skin is warm and dry.  Neurological:     Mental Status: He is alert and oriented to person, place, and time.  Psychiatric:        Mood and Affect: Mood normal.      ____________________________________________   LABS (all labs ordered are listed, but only abnormal results are displayed)  Labs Reviewed  COMPREHENSIVE METABOLIC PANEL - Abnormal; Notable for the following components:      Result Value   Glucose, Bld 109 (*)    ALT 46 (*)    All other components within normal limits  CBC WITH DIFFERENTIAL/PLATELET - Abnormal; Notable for the following components:   Lymphs Abs 0.6 (*)    All other components within normal limits  RESPIRATORY PANEL BY RT PCR (FLU A&B, COVID)  LIPASE, BLOOD   ____________________________________________   ____________________________________________  RADIOLOGY  ED MD interpretation: No focal consolidations, effusion, pneumothorax, edema, or evidence of pneumopericardium or subdiaphragmatic free air.  Official radiology report(s): DG Chest 2 View  Result Date: 12/16/2019 CLINICAL DATA:  Vomiting. EXAM: CHEST - 2 VIEW COMPARISON:  December 07, 2019. FINDINGS: The heart size and mediastinal contours are within normal limits. Both lungs are clear. The visualized skeletal structures are unremarkable. IMPRESSION: No active cardiopulmonary disease. Electronically Signed   By: Lupita Raider M.D.   On: 12/16/2019 16:52    ____________________________________________   PROCEDURES  Procedure(s) performed (including Critical  Care):  Procedures   ____________________________________________   INITIAL IMPRESSION / ASSESSMENT AND PLAN / ED COURSE        Patient presents for assessment of hematemesis that began earlier today and was preceded by nonbloody vomiting and some nausea and epigastric discomfort.  Patient is slightly hypertensive with a BP of 149/87 with otherwise stable vital signs on room air.  Primary differential includes but is not limited to hematemesis secondary to peptic ulcer disease, gastritis, and Mallory-Weiss tear.  Very low suspicion for full-thickness tear at this time given reassuring EKG, stable vital signs, no free air visualized on chest x-ray, no evidence of Hamman's crunch on exam, leukocytosis, fever, or other historical or exam features.  Very low suspicion for lower GI bleed given no report of melena or gross blood blood per rectum and episodes were preceded by nonbloody emesis.  Patient  is not anticoagulated.  Vital signs are stable and hemoglobin is within normal limits.  Lipase is not consistent with acute pancreatitis.  CMP shows no evidence of significant electrolyte metabolic derangements, cholestasis, or elevated bilirubin.  No source of bleeding in the oropharynx.  Patient did request a Covid test due to some congestion which she thinks is likely related to allergies but he wishes to double check.  Covid test was ordered.  Patient states he feels better on my reassessment.  Overall impression is likely infectious gastritis resulting in bleeding gastritis versus Mallory-Weiss tear.  I believe patient safe for discharge at this time and patient was discharged stable condition.  Strict return precautions advised discussed.  Rx written for Zofran and Protonix.   _________________________________________   FINAL CLINICAL IMPRESSION(S) / ED DIAGNOSES  Final diagnoses:  Hematemesis with nausea    Medications  ondansetron (ZOFRAN) injection 4 mg (4 mg Intravenous Given 12/16/19  1640)  lactated ringers bolus 1,000 mL (1,000 mLs Intravenous New Bag/Given 12/16/19 1640)  pantoprazole (PROTONIX) injection 40 mg (40 mg Intravenous Given 12/16/19 1640)  alum & mag hydroxide-simeth (MAALOX/MYLANTA) 200-200-20 MG/5ML suspension 30 mL (30 mLs Oral Given 12/16/19 1640)     ED Discharge Orders         Ordered    ondansetron (ZOFRAN) 4 MG tablet  Every 8 hours PRN        12/16/19 1714    pantoprazole (PROTONIX) 40 MG tablet  Daily        12/16/19 1714           Note:  This document was prepared using Dragon voice recognition software and may include unintentional dictation errors.   Gilles Chiquito, MD 12/16/19 [phone]

## 2019-12-16 NOTE — ED Notes (Signed)
Pt taken to xray 

## 2019-12-16 NOTE — ED Triage Notes (Signed)
Pt states that he has been vomiting since 7am and thought he had a stomach bug- around 2pm pt began vomiting bright red blood x1- pt denies eating or drinking anything red today

## 2019-12-17 ENCOUNTER — Telehealth (HOSPITAL_COMMUNITY): Payer: Self-pay

## 2019-12-17 ENCOUNTER — Telehealth: Payer: Self-pay | Admitting: Physician Assistant

## 2019-12-17 NOTE — ED Notes (Signed)
Spoke to pateint on phone and explained positive result for covid.

## 2019-12-17 NOTE — Telephone Encounter (Signed)
Called to Discuss with patient about Covid symptoms and the use of the monoclonal antibody infusion for those with mild to moderate Covid symptoms and at a high risk of hospitalization.     Pt appears to qualify for this infusion due to co-morbid conditions and/or a member of an at-risk group in accordance with the FDA Emergency Use Authorization.    Risk factor: BMI  Pre-screened by RN and ready for APP to call and further discuss/schedule for monoclonal antibody infusion appt.  Pt reports testing positive yesterday, 12/16/19 at [facility] ED. Pt does not know when sx began, states URI sx X 1 month since "when weather changed". Denies cough and/or fever. Went to ED yesterday due to throwing up blood.

## 2019-12-17 NOTE — Telephone Encounter (Signed)
Called to discuss with Maryella Shivers about Covid symptoms and the use of  monoclonal antibody infusion for those with mild to moderate Covid symptoms and at a high risk of hospitalization.     Pt is qualified for this infusion due to co-morbid conditions and/or a member of an at-risk group, however declines infusion at this time.  Symptoms reviewed as well as criteria for ending isolation. Symptoms reviewed that would warrant ED/Hospital evaluation. Preventative practices reviewed. Patient verbalized understanding. Patient advised to call back if he decides that he does want to get infusion. Callback number to the infusion center given. Patient advised to go to Urgent care or ED with severe symptoms.   Patient with hx of year round allergies. Had congestion for past one month. Tested negative for COVID on 10/19. However noted new symptoms of nausea and vomiting started on 12/16/19 and went to ER where he tested positive for COVID.   I believe his symptoms started on 10/28. He want to think about infusion. My chart message  sent with informations. He will reach out to [place] if interested for infusion.   Risk factors: BMI 38.69 and unvaccinated for COVID 19

## 2019-12-20 ENCOUNTER — Other Ambulatory Visit: Payer: Self-pay | Admitting: Physician Assistant

## 2019-12-20 DIAGNOSIS — U071 COVID-19: Secondary | ICD-10-CM

## 2019-12-20 NOTE — Progress Notes (Signed)
I connected by phone with Charles Henderson on 12/20/2019 at 1:12 PM to discuss the potential use of a new treatment for mild to moderate COVID-19 viral infection in non-hospitalized patients.  This patient is a 24 y.o. male that meets the FDA criteria for Emergency Use Authorization of COVID monoclonal antibody casirivimab/imdevimab or bamlanivimab/eteseviamb.  Has a (+) direct SARS-CoV-2 viral test result  Has mild or moderate COVID-19   Is NOT hospitalized due to COVID-19  Is within 10 days of symptom onset  Has at least one of the high risk factor(s) for progression to severe COVID-19 and/or hospitalization as defined in EUA.  Specific high risk criteria : BMI > 25 and Other high risk medical condition per CDC:  unvaccinated for COVID 19   I have spoken and communicated the following to the patient or parent/caregiver regarding COVID monoclonal antibody treatment:  1. FDA has authorized the emergency use for the treatment of mild to moderate COVID-19 in adults and pediatric patients with positive results of direct SARS-CoV-2 viral testing who are 42 years of age and older weighing at least 40 kg, and who are at high risk for progressing to severe COVID-19 and/or hospitalization.  2. The significant known and potential risks and benefits of COVID monoclonal antibody, and the extent to which such potential risks and benefits are unknown.  3. Information on available alternative treatments and the risks and benefits of those alternatives, including clinical trials.  4. Patients treated with COVID monoclonal antibody should continue to self-isolate and use infection control measures (e.g., wear mask, isolate, social distance, avoid sharing personal items, clean and disinfect "high touch" surfaces, and frequent handwashing) according to CDC guidelines.   5. The patient or parent/caregiver has the option to accept or refuse COVID monoclonal antibody treatment.  After reviewing this  information with the patient, the patient has agreed to receive one of the available covid 19 monoclonal antibodies and will be provided an appropriate fact sheet prior to infusion. [name], [place] 12/20/2019 1:12 PM

## 2019-12-21 ENCOUNTER — Ambulatory Visit (HOSPITAL_COMMUNITY)
Admission: RE | Admit: 2019-12-21 | Discharge: 2019-12-21 | Disposition: A | Discharge status: Home / Self Care | Payer: Medicaid Other | Source: Ambulatory Visit | Attending: Pulmonary Disease | Admitting: Pulmonary Disease

## 2019-12-21 DIAGNOSIS — U071 COVID-19: Secondary | ICD-10-CM | POA: Diagnosis not present

## 2019-12-21 MED ORDER — ALBUTEROL SULFATE HFA 108 (90 BASE) MCG/ACT IN AERS: 2.0000 | INHALATION_SPRAY | Freq: Once | RESPIRATORY_TRACT | Status: DC | PRN

## 2019-12-21 MED ORDER — EPINEPHRINE 0.3 MG/0.3ML IJ SOAJ: 0.3000 mg | Freq: Once | INTRAMUSCULAR | Status: DC | PRN

## 2019-12-21 MED ORDER — SODIUM CHLORIDE 0.9 % IV SOLN: INTRAVENOUS | Status: DC | PRN

## 2019-12-21 MED ORDER — METHYLPREDNISOLONE SODIUM SUCC 125 MG IJ SOLR: 125.0000 mg | Freq: Once | INTRAMUSCULAR | Status: DC | PRN

## 2019-12-21 MED ORDER — SOTROVIMAB 500 MG/8ML IV SOLN
500.0000 mg | Freq: Once | INTRAVENOUS | Status: AC
  Administered 2019-12-21: 500 mg via INTRAVENOUS

## 2019-12-21 MED ORDER — DIPHENHYDRAMINE HCL 50 MG/ML IJ SOLN: 50.0000 mg | Freq: Once | INTRAMUSCULAR | Status: DC | PRN

## 2019-12-21 MED ORDER — FAMOTIDINE IN NACL 20-0.9 MG/50ML-% IV SOLN: 20.0000 mg | Freq: Once | INTRAVENOUS | Status: DC | PRN

## 2019-12-21 NOTE — Discharge Instructions (Signed)

## 2019-12-21 NOTE — Progress Notes (Addendum)
  Diagnosis: COVID-19  Physician: Patrick Wright, MD  Procedure: Sotrovimab Infusion   Complications: No immediate complications noted.  Discharge: Discharged home   Demetrick Eichenberger N Adelei Scobey 12/21/2019  

## 2020-05-19 ENCOUNTER — Ambulatory Visit
Admission: EM | Admit: 2020-05-19 | Discharge: 2020-05-19 | Disposition: A | Discharge status: Home / Self Care | Payer: BC Managed Care – PPO | Attending: Sports Medicine | Admitting: Sports Medicine

## 2020-05-19 ENCOUNTER — Other Ambulatory Visit: Payer: Self-pay

## 2020-05-19 DIAGNOSIS — R0981 Nasal congestion: Secondary | ICD-10-CM | POA: Insufficient documentation

## 2020-05-19 DIAGNOSIS — R0982 Postnasal drip: Secondary | ICD-10-CM | POA: Diagnosis not present

## 2020-05-19 DIAGNOSIS — Z8616 Personal history of COVID-19: Secondary | ICD-10-CM | POA: Insufficient documentation

## 2020-05-19 DIAGNOSIS — R051 Acute cough: Secondary | ICD-10-CM | POA: Insufficient documentation

## 2020-05-19 DIAGNOSIS — Z882 Allergy status to sulfonamides status: Secondary | ICD-10-CM | POA: Insufficient documentation

## 2020-05-19 DIAGNOSIS — J069 Acute upper respiratory infection, unspecified: Secondary | ICD-10-CM | POA: Diagnosis not present

## 2020-05-19 DIAGNOSIS — Z20822 Contact with and (suspected) exposure to covid-19: Secondary | ICD-10-CM | POA: Insufficient documentation

## 2020-05-19 DIAGNOSIS — J302 Other seasonal allergic rhinitis: Secondary | ICD-10-CM | POA: Diagnosis not present

## 2020-05-19 MED ORDER — FLUTICASONE PROPIONATE 50 MCG/ACT NA SUSP: 2.0000 | Freq: Every day | NASAL | 0 refills | Status: AC

## 2020-05-19 MED ORDER — BENZONATATE 100 MG PO CAPS: 100.0000 mg | ORAL_CAPSULE | Freq: Three times a day (TID) | ORAL | 0 refills | Status: AC

## 2020-05-19 NOTE — Discharge Instructions (Signed)
Your Covid test was pending at the time of discharge.  Someone will contact you if it is positive.  Please follow along with the app MyChart if you want to know your results. You have a viral upper respiratory infection with cough.  I have given you a cough medicine.  He can get that at the pharmacy. Also with your seasonal allergies you have nasal congestion and postnasal drainage.  I encourage you to do sinus flushes.  I prescribed a nasal spray to assist with your symptoms. If your symptoms persist please see your primary care provider.  If they worsen then please go to the ER. I gave you a work note saying you were seen today and you can go back to work today.  I hope you get to feeling better, Dr. Zachery Dauer

## 2020-05-19 NOTE — ED Triage Notes (Signed)
Pt reports having non productive cough, nasal congestion and bila ear pain. Symptoms began on Sunday.

## 2020-05-20 LAB — SARS CORONAVIRUS 2 (TAT 6-24 HRS): SARS Coronavirus 2: NEGATIVE

## 2020-05-22 NOTE — ED Provider Notes (Signed)
[facility]    CSN: [id number] Arrival date & time: 05/19/20  1020      History   Chief Complaint Chief Complaint  Patient presents with  . Cough    HPI Charles Henderson is a 25 y.o. male.   Patient is a pleasant 25 year old male who presents for evaluation of the above issues.  He reports 5 days of cough, congestion, throat irritation, postnasal drip.  He works over at [employer].  He does not have a primary care provider.  He does have a history of seasonal allergies and takes over-the-counter medicines for this.  He denies any fever shakes chills.  No nausea vomiting diarrhea.  He is not vaccinated against COVID or influenza.  He denies Covid exposure.  He did have COVID back in October 2021.  He denies any chest pain or shortness of breath.  No red flag signs or symptoms elicited on history.     History reviewed. No pertinent past medical history.  There are no problems to display for this patient.   History reviewed. No pertinent surgical history.     Home Medications    Prior to Admission medications   Medication Sig Start Date End Date Taking? Authorizing Provider  benzonatate (TESSALON) 100 MG capsule Take 1 capsule (100 mg total) by mouth every 8 (eight) hours. 05/19/20  Yes Delton See, MD  fluticasone [facility]) 50 MCG/ACT nasal spray Place 2 sprays into both nostrils daily. 05/19/20  Yes Delton See, MD  ondansetron (ZOFRAN) 4 MG tablet Take 1 tablet (4 mg total) by mouth every 8 (eight) hours as needed for up to 10 doses for nausea or vomiting. 12/16/19   Gilles Chiquito, MD  pantoprazole (PROTONIX) 40 MG tablet Take 1 tablet (40 mg total) by mouth daily for 14 days. 12/16/19 12/30/19  Gilles Chiquito, MD    Family History No family history on file.  Social History Social History   Tobacco Use  . Smoking status: Never Smoker  . Smokeless tobacco: Never Used  Substance Use Topics  . Alcohol use: Yes  . Drug use: No     Allergies    Sulfa antibiotics   Review of Systems Review of Systems  Constitutional: Negative for activity change, appetite change, chills, diaphoresis, fatigue and fever.  HENT: Positive for congestion, ear pain, postnasal drip and sore throat. Negative for ear discharge, rhinorrhea, sinus pressure, sinus pain and sneezing.   Eyes: Negative.  Negative for pain.  Respiratory: Positive for cough. Negative for chest tightness, shortness of breath, wheezing and stridor.   Cardiovascular: Negative.  Negative for chest pain and palpitations.  Gastrointestinal: Negative.  Negative for abdominal pain, constipation, diarrhea, nausea and vomiting.  Genitourinary: Negative.  Negative for dysuria.  Musculoskeletal: Negative for back pain and myalgias.  Skin: Negative.  Negative for color change, pallor, rash and wound.  Neurological: Negative.  Negative for dizziness, tremors, seizures, syncope, numbness and headaches.  All other systems reviewed and are negative.    Physical Exam Triage Vital Signs ED Triage Vitals  Enc Vitals Group     BP 05/19/20 1028 (!) 150/106     Pulse Rate 05/19/20 1028 98     Resp 05/19/20 1028 18     Temp 05/19/20 1028 98.5 F (36.9 C)     Temp Source 05/19/20 1028 Oral     SpO2 05/19/20 1028 100 %     Weight 05/19/20 1029 240 lb (108.9 kg)     Height 05/19/20 1029 5'  9" (1.753 m)     Head Circumference --      Peak Flow --      Pain Score 05/19/20 1029 9     Pain Loc --      Pain Edu? --      Excl. in GC? --    No data found.  Updated Vital Signs BP (!) 150/106   Pulse 98   Temp 98.5 F (36.9 C) (Oral)   Resp 18   Ht 5\' 9"  (1.753 m)   Wt 108.9 kg   SpO2 100%   BMI 35.44 kg/m   Visual Acuity Right Eye Distance:   Left Eye Distance:   Bilateral Distance:    Right Eye Near:   Left Eye Near:    Bilateral Near:     Physical Exam Vitals and nursing note reviewed.  Constitutional:      General: He is not in acute distress.    Appearance: Normal  appearance. He is not ill-appearing, toxic-appearing or diaphoretic.  HENT:     Head: Normocephalic and atraumatic.     Right Ear: Tympanic membrane normal.     Left Ear: Tympanic membrane normal.     Nose: Congestion present. No rhinorrhea.     Mouth/Throat:     Mouth: Mucous membranes are moist.     Pharynx: No oropharyngeal exudate or posterior oropharyngeal erythema.  Eyes:     General: No scleral icterus.       Right eye: No discharge.        Left eye: No discharge.     Extraocular Movements: Extraocular movements intact.     Conjunctiva/sclera: Conjunctivae normal.     Pupils: Pupils are equal, round, and reactive to light.  Cardiovascular:     Rate and Rhythm: Normal rate and regular rhythm.     Pulses: Normal pulses.     Heart sounds: Normal heart sounds. No murmur heard. No friction rub. No gallop.   Pulmonary:     Effort: Pulmonary effort is normal. No respiratory distress.     Breath sounds: Normal breath sounds. No stridor. No wheezing, rhonchi or rales.  Musculoskeletal:     Cervical back: Normal range of motion and neck supple. No rigidity or tenderness.  Lymphadenopathy:     Cervical: Cervical adenopathy present.  Skin:    General: Skin is warm and dry.     Capillary Refill: Capillary refill takes less than 2 seconds.     Coloration: Skin is not jaundiced.     Findings: No bruising, erythema, lesion or rash.  Neurological:     General: No focal deficit present.     Mental Status: He is alert and oriented to person, place, and time.      UC Treatments / Results  Labs (all labs ordered are listed, but only abnormal results are displayed) Labs Reviewed  SARS CORONAVIRUS 2 (TAT 6-24 HRS)    EKG   Radiology No results found.  Procedures Procedures (including critical care time)  Medications Ordered in UC Medications - No data to display  Initial Impression / Assessment and Plan / UC Course  I have reviewed the triage vital signs and the nursing  notes.  Pertinent labs & imaging results that were available during my care of the patient were reviewed by me and considered in my medical decision making (see chart for details).  Clinical impression: 5 days of nasal congestion, postnasal drainage, throat irritation and cough.  Complicating his situation is he has seasonal allergies.  His presentation is concerning for a viral URI versus a seasonal allergy flare.  Treatment plan: 1.  The findings and treatment plan were discussed in detail with the patient.  Patient was in agreement. 2.  We will go ahead and do Covid test.  It was pending at the time of discharge. 3.  Given his seasonal allergies I went ahead and gave him a Flonase nasal spray.  He will use it as directed. 4.  Given his cough I gave him some Tessalon Perles. 5.  Educational handout was provided. 6.  Supportive care, over-the-counter meds as needed, Tylenol or Motrin for any fever or discomfort. 7.  Work note was provided. 8.  If symptoms persist that he should see his primary care physician, if they worsen he should go to the ER. 9.  Follow-up here as needed.    Final Clinical Impressions(s) / UC Diagnoses   Final diagnoses:  Viral URI with cough  Nasal congestion  Post-nasal drainage  Seasonal allergies     Discharge Instructions     Your Covid test was pending at the time of discharge.  Someone will contact you if it is positive.  Please follow along with the app MyChart if you want to know your results. You have a viral upper respiratory infection with cough.  I have given you a cough medicine.  He can get that at the pharmacy. Also with your seasonal allergies you have nasal congestion and postnasal drainage.  I encourage you to do sinus flushes.  I prescribed a nasal spray to assist with your symptoms. If your symptoms persist please see your primary care provider.  If they worsen then please go to the ER. I gave you a work note saying you were seen today  and you can go back to work today.  I hope you get to feeling better, Dr. Zachery Dauer    ED Prescriptions    Medication Sig Dispense Auth. Provider   benzonatate (TESSALON) 100 MG capsule Take 1 capsule (100 mg total) by mouth every 8 (eight) hours. 21 capsule Delton See, MD   fluticasone [facility]) 50 MCG/ACT nasal spray Place 2 sprays into both nostrils daily. 15.8 mL Delton See, MD     PDMP not reviewed this encounter.   Delton See, MD 05/22/20 1420

## 2021-02-12 ENCOUNTER — Emergency Department
Admission: EM | Admit: 2021-02-12 | Discharge: 2021-02-13 | Disposition: A | Discharge status: Home / Self Care | Payer: BC Managed Care – PPO | Attending: Emergency Medicine | Admitting: Emergency Medicine

## 2021-02-12 ENCOUNTER — Emergency Department: Payer: BC Managed Care – PPO

## 2021-02-12 ENCOUNTER — Other Ambulatory Visit: Payer: Self-pay

## 2021-02-12 ENCOUNTER — Encounter: Payer: Self-pay | Admitting: Emergency Medicine

## 2021-02-12 DIAGNOSIS — R079 Chest pain, unspecified: Secondary | ICD-10-CM | POA: Insufficient documentation

## 2021-02-12 DIAGNOSIS — Z5321 Procedure and treatment not carried out due to patient leaving prior to being seen by health care provider: Secondary | ICD-10-CM | POA: Insufficient documentation

## 2021-02-12 LAB — CBC WITH DIFFERENTIAL/PLATELET
Abs Immature Granulocytes: 0.04 10*3/uL (ref 0.00–0.07)
Basophils Absolute: 0.1 10*3/uL (ref 0.0–0.1)
Basophils Relative: 1 %
Eosinophils Absolute: 0.1 10*3/uL (ref 0.0–0.5)
Eosinophils Relative: 1 %
HCT: 43.4 % (ref 39.0–52.0)
Hemoglobin: 14.8 g/dL (ref 13.0–17.0)
Immature Granulocytes: 0 %
Lymphocytes Relative: 19 %
Lymphs Abs: 2.1 10*3/uL (ref 0.7–4.0)
MCH: 29.5 pg (ref 26.0–34.0)
MCHC: 34.1 g/dL (ref 30.0–36.0)
MCV: 86.6 fL (ref 80.0–100.0)
Monocytes Absolute: 0.9 10*3/uL (ref 0.1–1.0)
Monocytes Relative: 8 %
Neutro Abs: 8.1 10*3/uL — ABNORMAL HIGH (ref 1.7–7.7)
Neutrophils Relative %: 71 %
Platelets: 247 10*3/uL (ref 150–400)
RBC: 5.01 MIL/uL (ref 4.22–5.81)
RDW: 12.7 % (ref 11.5–15.5)
WBC: 11.3 10*3/uL — ABNORMAL HIGH (ref 4.0–10.5)
nRBC: 0 % (ref 0.0–0.2)

## 2021-02-12 LAB — COMPREHENSIVE METABOLIC PANEL
ALT: 30 U/L (ref 0–44)
AST: 23 U/L (ref 15–41)
Albumin: 4 g/dL (ref 3.5–5.0)
Alkaline Phosphatase: 64 U/L (ref 38–126)
Anion gap: 5 (ref 5–15)
BUN: 16 mg/dL (ref 6–20)
CO2: 25 mmol/L (ref 22–32)
Calcium: 8.8 mg/dL — ABNORMAL LOW (ref 8.9–10.3)
Chloride: 108 mmol/L (ref 98–111)
Creatinine, Ser: 0.81 mg/dL (ref 0.61–1.24)
GFR, Estimated: >60 mL/min (ref >60)
Glucose, Bld: 94 mg/dL (ref 70–99)
Potassium: 3.8 mmol/L (ref 3.5–5.1)
Sodium: 138 mmol/L (ref 135–145)
Total Bilirubin: 0.6 mg/dL (ref 0.3–1.2)
Total Protein: 6.9 g/dL (ref 6.5–8.1)

## 2021-02-12 LAB — TROPONIN I (HIGH SENSITIVITY): Troponin I (High Sensitivity): 3 ng/L (ref <18)

## 2021-02-12 NOTE — ED Triage Notes (Signed)
Patient ambulatory to triage with steady gait, without difficulty or distress noted ; pt reports mid CP radiating into left shoulder accomp by [facility] tonight; ASA taken PTA; denies hx of same

## 2021-02-13 LAB — TROPONIN I (HIGH SENSITIVITY): Troponin I (High Sensitivity): 3 ng/L (ref <18)

## 2021-02-13 NOTE — ED Notes (Signed)
No answer when called several times from lobby 

## 2022-03-10 IMAGING — CR DG CHEST 2V
1 series · 2 of 2 positions shown · non-contrast
Comparison: December 07, 2019.

CLINICAL DATA: Vomiting.

EXAM:
CHEST - 2 VIEW

[Series 1: dg chest 2 view · 0.14mm/px · 2 of 2 slices shown]
[im 1/2]
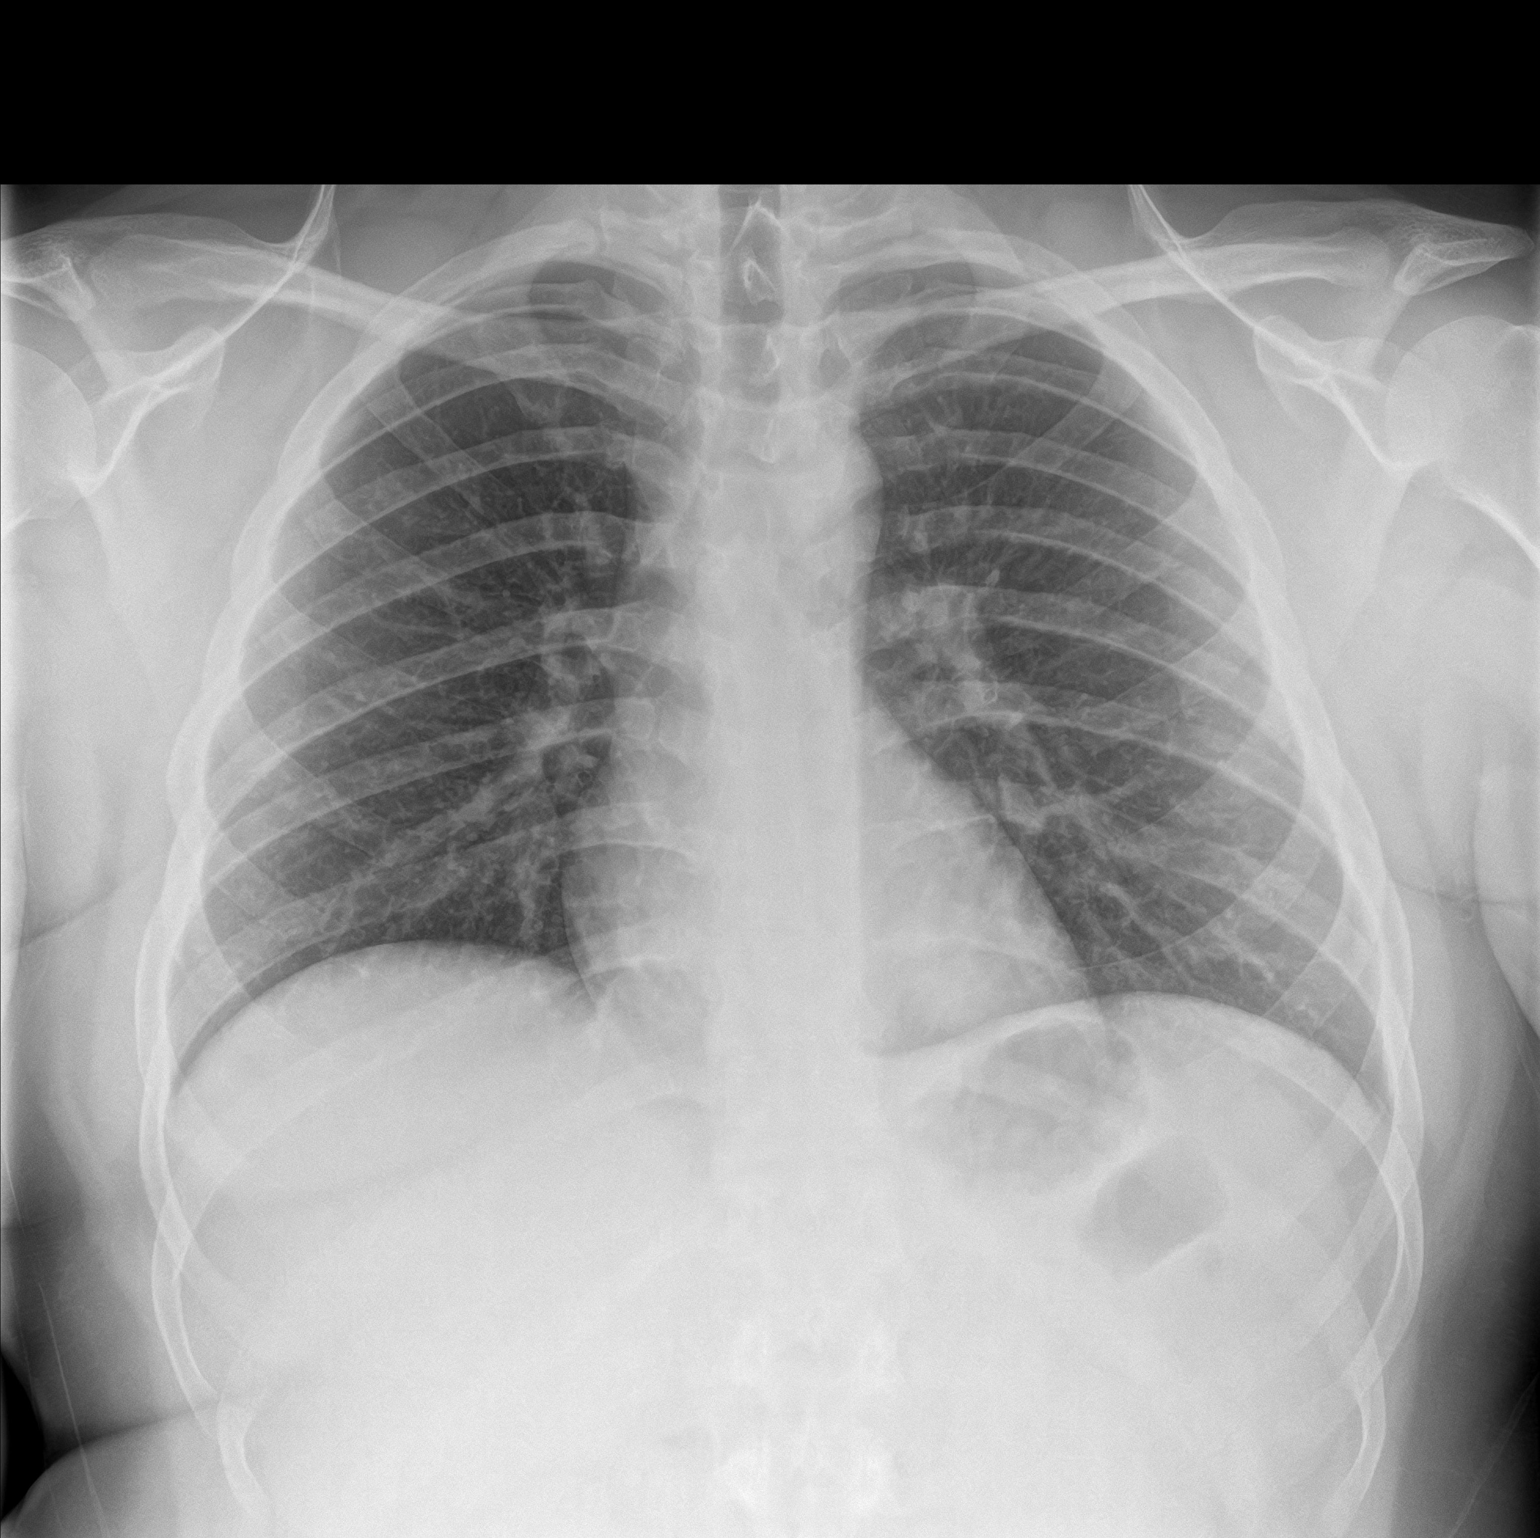
[im 2/2]
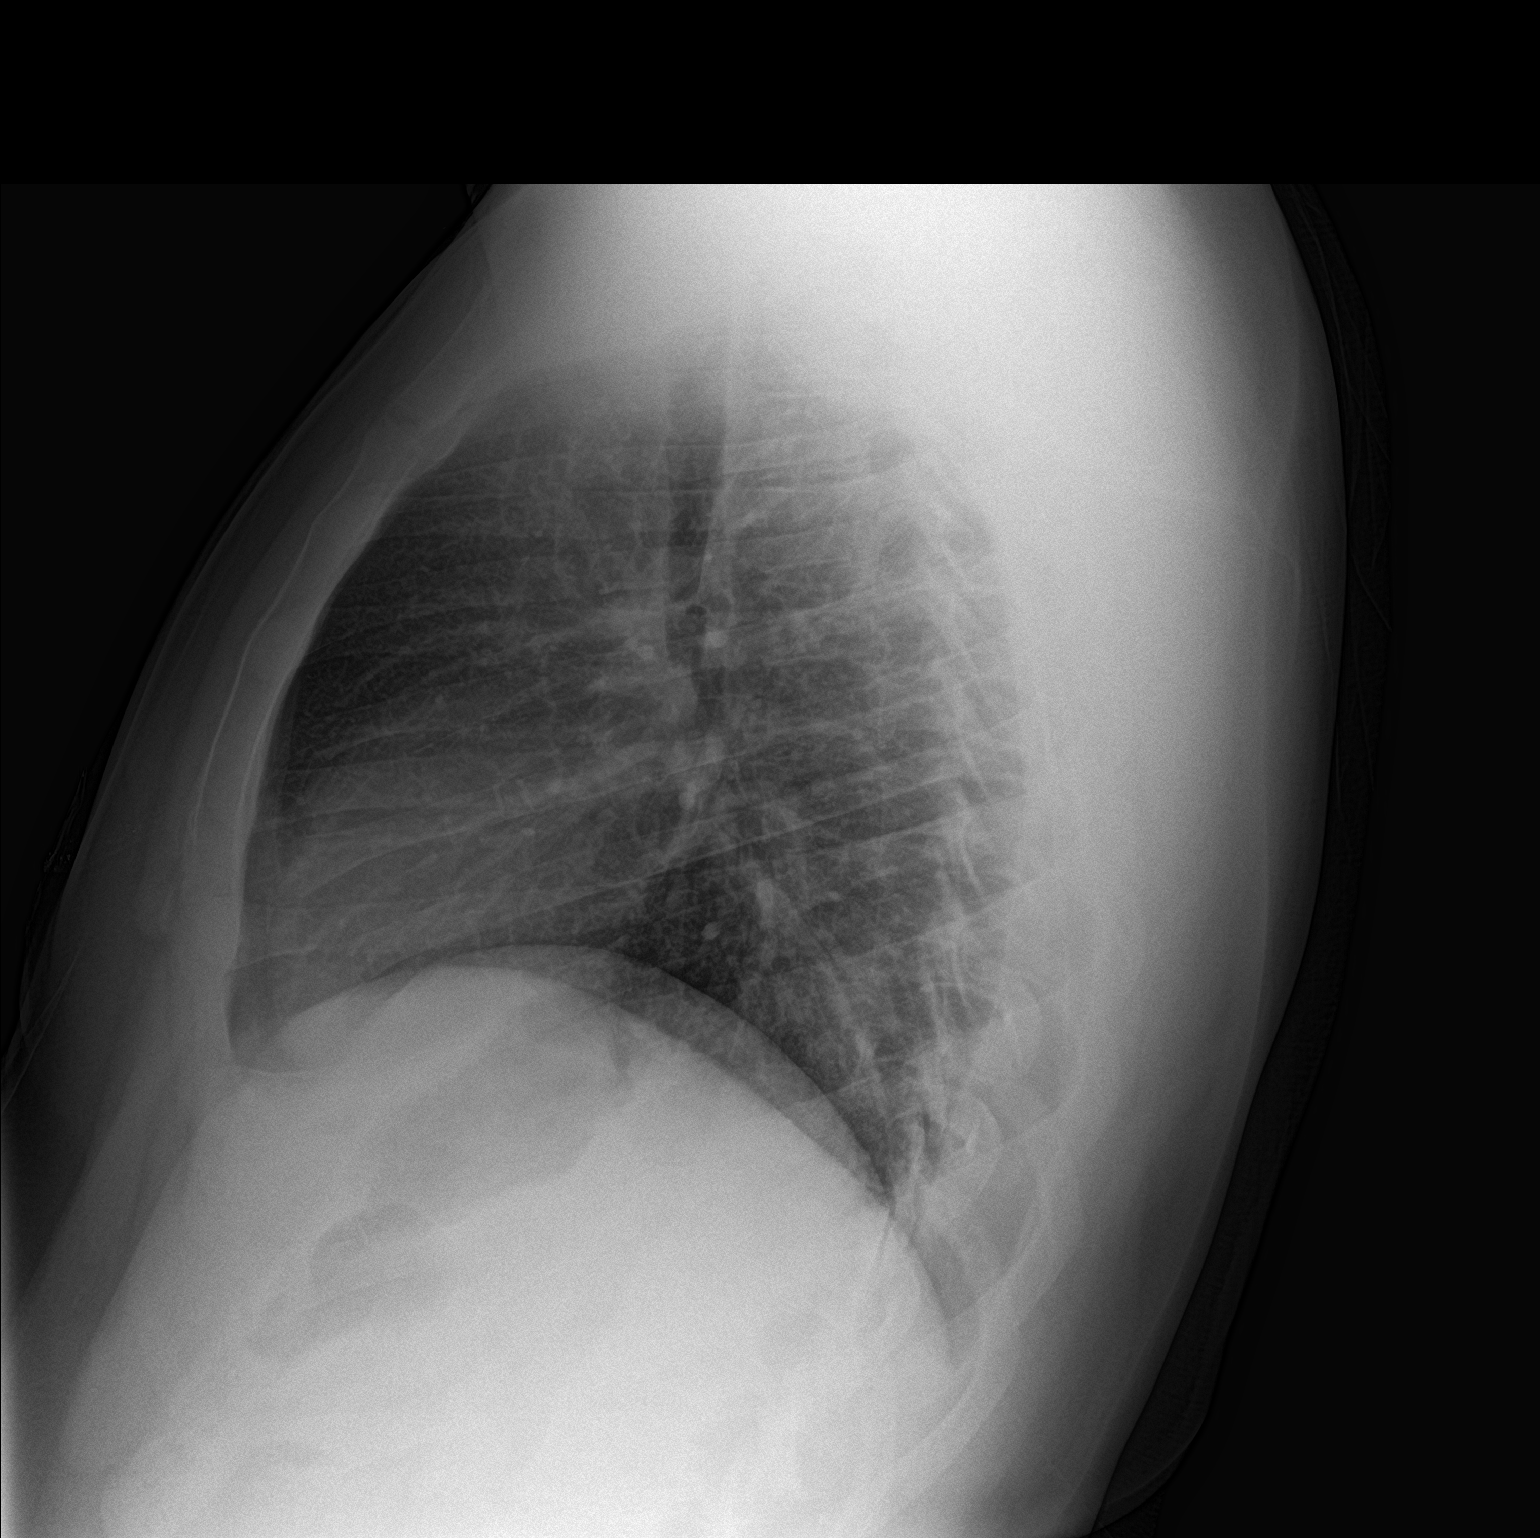

[2 of 2 positions shown; findings below may reference images not displayed]

FINDINGS: The heart size and mediastinal contours are within normal limits.
Both lungs are clear. The visualized skeletal structures are
unremarkable.
IMPRESSION: No active cardiopulmonary disease.

## 2023-05-08 IMAGING — CR DG CHEST 2V
1 series · 2 of 2 positions shown · non-contrast
Comparison: December 16, 2019

CLINICAL DATA: Chest pain radiating to the left shoulder with
shortness of breath.

EXAM:
CHEST - 2 VIEW

[Series 1: dg chest 2 view · 0.14mm/px · 2 of 2 slices shown]
[im 1/2]
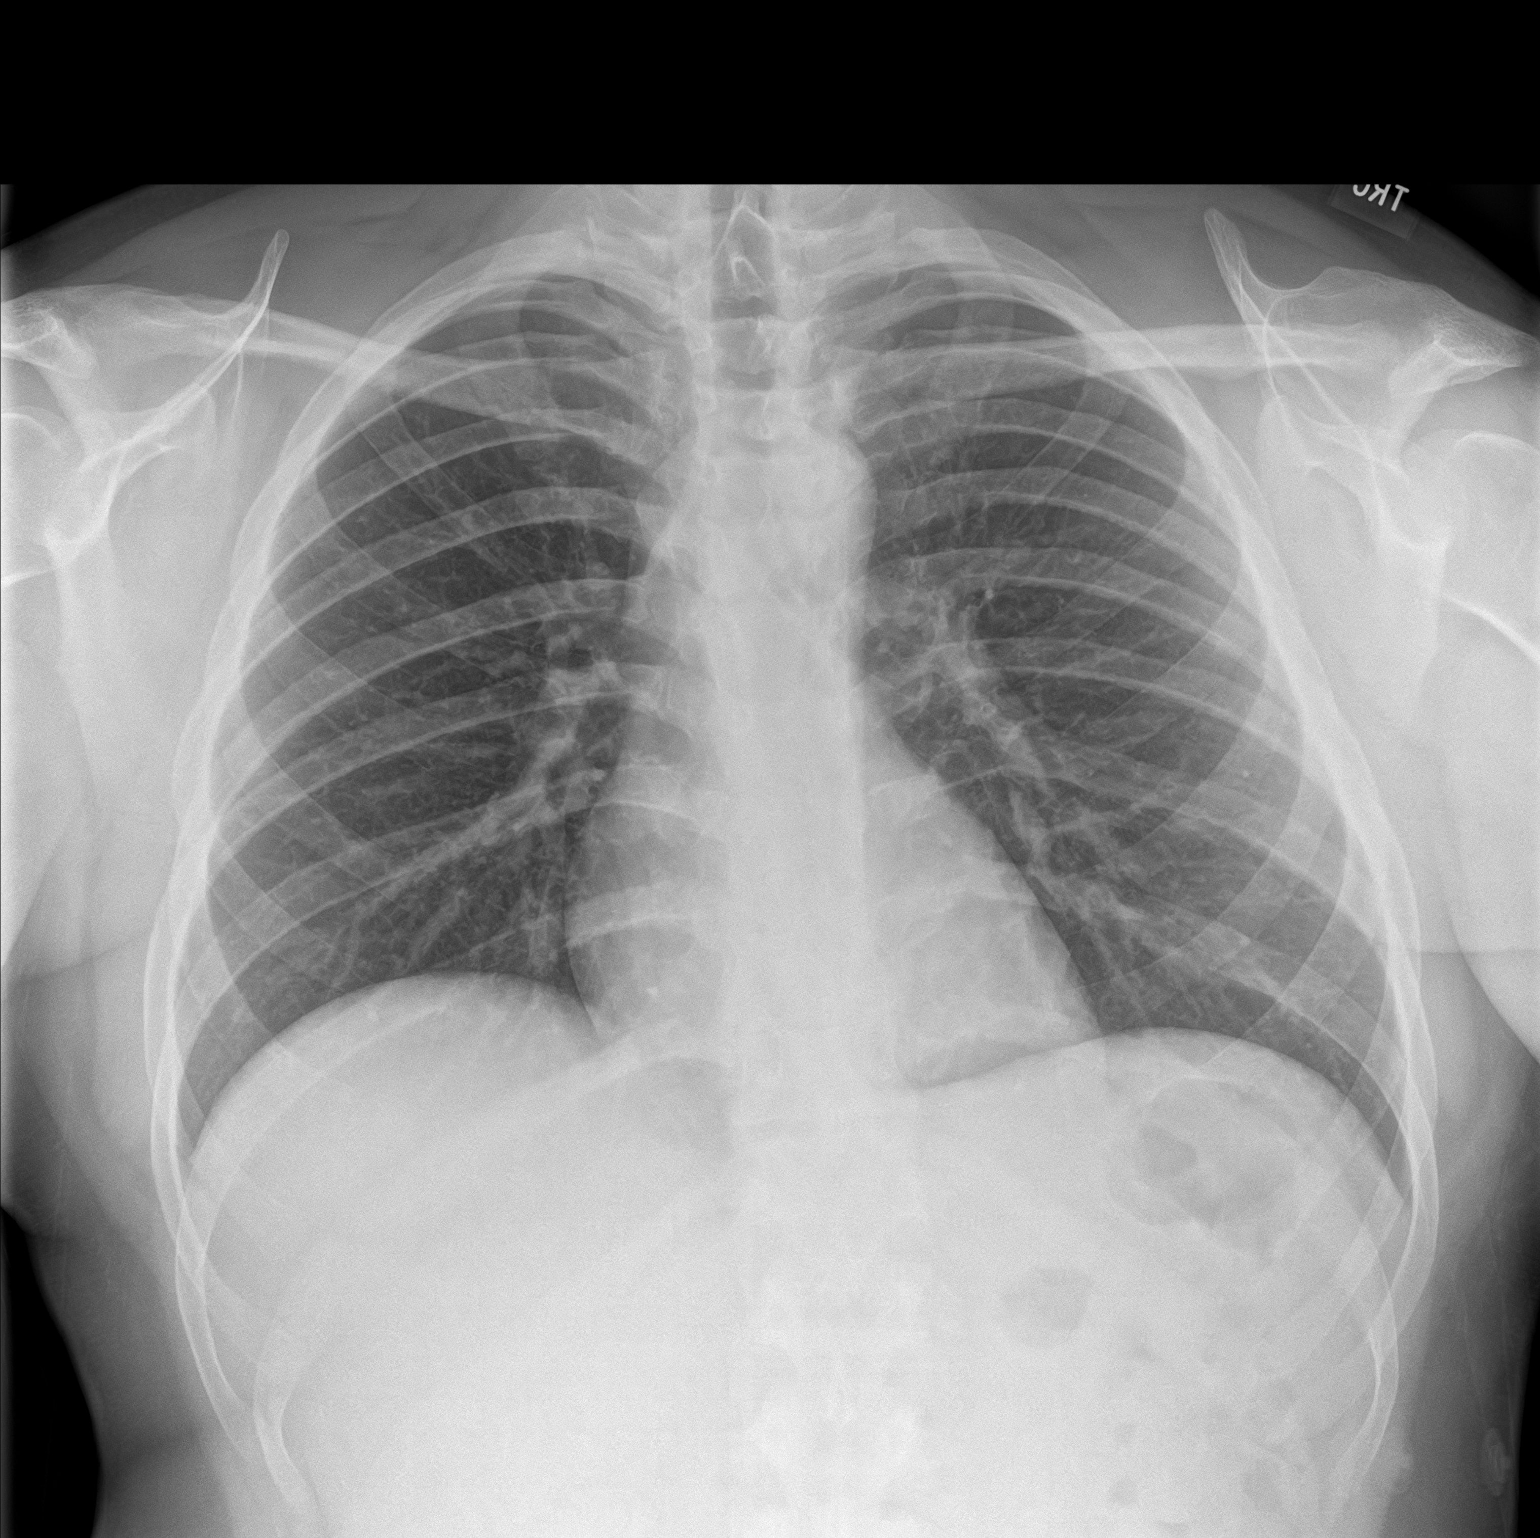
[im 2/2]
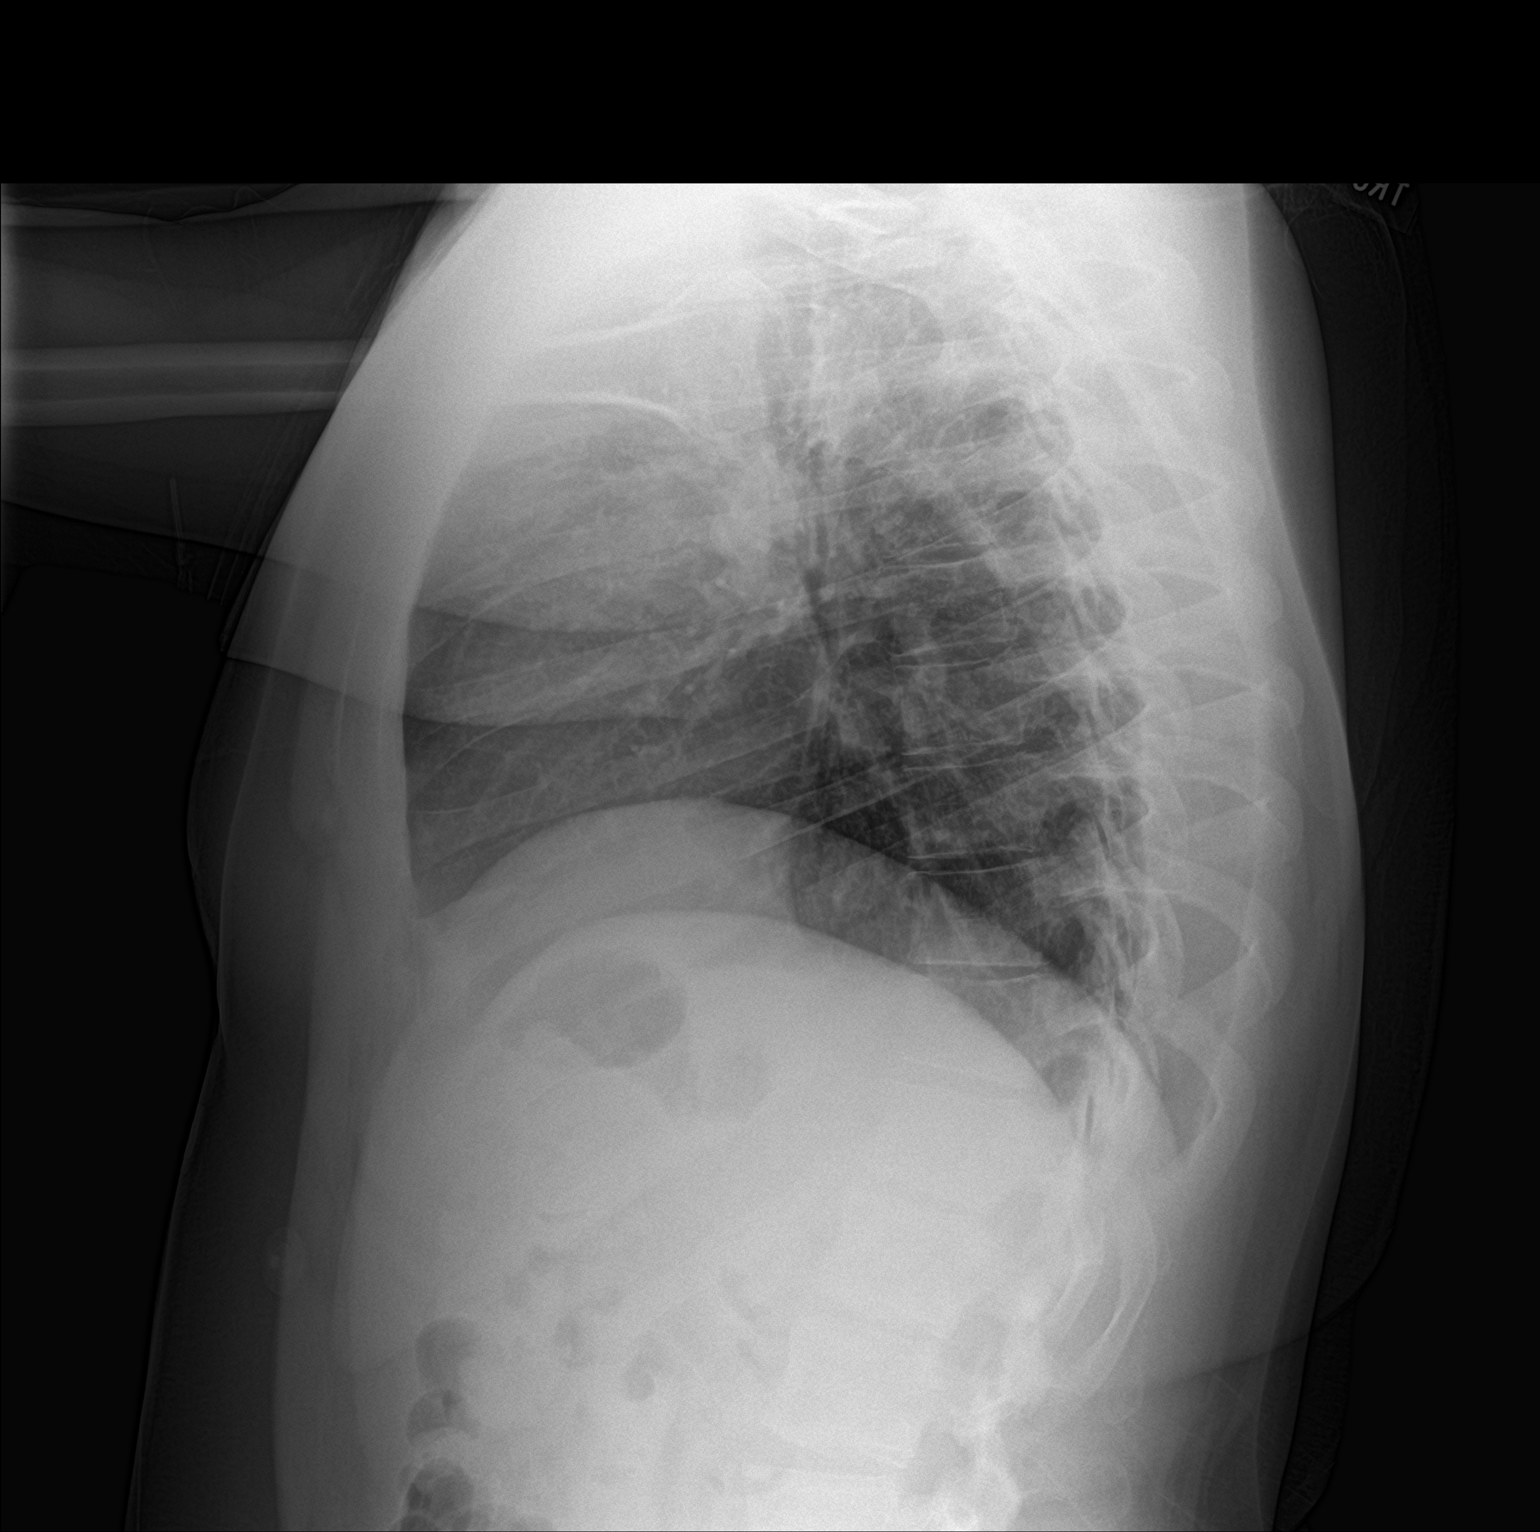

[2 of 2 positions shown; findings below may reference images not displayed]

FINDINGS: The heart size and mediastinal contours are within normal limits.
Both lungs are clear. The visualized skeletal structures are
unremarkable.
IMPRESSION: No active cardiopulmonary disease.

## 2024-01-08 ENCOUNTER — Emergency Department: Payer: Self-pay

## 2024-01-08 ENCOUNTER — Emergency Department
Admission: EM | Admit: 2024-01-08 | Discharge: 2024-01-08 | Disposition: A | Discharge status: Home / Self Care | Payer: Self-pay | Attending: Emergency Medicine | Admitting: Emergency Medicine

## 2024-01-08 DIAGNOSIS — L0291 Cutaneous abscess, unspecified: Secondary | ICD-10-CM

## 2024-01-08 DIAGNOSIS — L0231 Cutaneous abscess of buttock: Secondary | ICD-10-CM | POA: Insufficient documentation

## 2024-01-08 LAB — BASIC METABOLIC PANEL WITH GFR
Anion gap: 10 (ref 5–15)
BUN: 10 mg/dL (ref 6–20)
CO2: 24 mmol/L (ref 22–32)
Calcium: 9.1 mg/dL (ref 8.9–10.3)
Chloride: 103 mmol/L (ref 98–111)
Creatinine, Ser: 0.85 mg/dL (ref 0.61–1.24)
GFR, Estimated: >60 mL/min (ref >60)
Glucose, Bld: 92 mg/dL (ref 70–99)
Potassium: 4.3 mmol/L (ref 3.5–5.1)
Sodium: 137 mmol/L (ref 135–145)

## 2024-01-08 LAB — CBC WITH DIFFERENTIAL/PLATELET
Abs Immature Granulocytes: 0.06 K/uL (ref 0.00–0.07)
Basophils Absolute: 0 K/uL (ref 0.0–0.1)
Basophils Relative: 0 %
Eosinophils Absolute: 0.1 K/uL (ref 0.0–0.5)
Eosinophils Relative: 1 %
HCT: 38 % — ABNORMAL LOW (ref 39.0–52.0)
Hemoglobin: 12.2 g/dL — ABNORMAL LOW (ref 13.0–17.0)
Immature Granulocytes: 1 %
Lymphocytes Relative: 8 %
Lymphs Abs: 1 K/uL (ref 0.7–4.0)
MCH: 26.5 pg (ref 26.0–34.0)
MCHC: 32.1 g/dL (ref 30.0–36.0)
MCV: 82.6 fL (ref 80.0–100.0)
Monocytes Absolute: 1.3 K/uL — ABNORMAL HIGH (ref 0.1–1.0)
Monocytes Relative: 10 %
Neutro Abs: 10.7 K/uL — ABNORMAL HIGH (ref 1.7–7.7)
Neutrophils Relative %: 80 %
Platelets: 227 K/uL (ref 150–400)
RBC: 4.6 MIL/uL (ref 4.22–5.81)
RDW: 13.6 % (ref 11.5–15.5)
WBC: 13.1 K/uL — ABNORMAL HIGH (ref 4.0–10.5)
nRBC: 0 % (ref 0.0–0.2)

## 2024-01-08 MED ORDER — OXYCODONE-ACETAMINOPHEN 5-325 MG PO TABS: 1.0000 | ORAL_TABLET | ORAL | 0 refills | Status: AC | PRN

## 2024-01-08 MED ORDER — IOHEXOL 300 MG/ML  SOLN
100.0000 mL | Freq: Once | INTRAMUSCULAR | Status: AC | PRN
  Administered 2024-01-08: 100 mL via INTRAVENOUS

## 2024-01-08 MED ORDER — CEPHALEXIN 500 MG PO CAPS: 500.0000 mg | ORAL_CAPSULE | Freq: Three times a day (TID) | ORAL | 0 refills | Status: AC

## 2024-01-08 MED ORDER — LIDOCAINE HCL (PF) 1 % IJ SOLN
5.0000 mL | Freq: Once | INTRAMUSCULAR | Status: AC
  Administered 2024-01-08: 5 mL via INTRADERMAL
  Filled 2024-01-08: qty 5

## 2024-01-08 MED ORDER — DOXYCYCLINE HYCLATE 100 MG PO TABS
100.0000 mg | ORAL_TABLET | Freq: Two times a day (BID) | ORAL | 0 refills | Status: AC
Start: 2024-01-08 — End: ?

## 2024-01-08 MED ORDER — LIDOCAINE-EPINEPHRINE-TETRACAINE (LET) TOPICAL GEL
3.0000 mL | Freq: Once | TOPICAL | Status: AC
  Administered 2024-01-08: 3 mL via TOPICAL
  Filled 2024-01-08: qty 3

## 2024-01-08 NOTE — ED Triage Notes (Signed)
 Pt presents to the ED via POV from home with possible abscess. Pt reports he has a large uncomfortable lump in his intergluteal cleft. Pt states that he noticed this yesterday. Pt denies fevers or chills.

## 2024-01-08 NOTE — Discharge Instructions (Signed)
 Carefully clean around the area.  Leave the bandage on for 24 hours unless you bleed through it.  Then reapply a maxi pad as this will absorb better than a regular bandage. Take the medication as prescribed. Return to emergency department in 2 days for packing removal or wound recheck. Follow-up with surgery, please call for an appointment so they can recheck this next week

## 2024-01-08 NOTE — ED Provider Notes (Signed)
 [facility] Provider Note    Event Date/Time   First MD Initiated Contact with Patient 01/08/24 1008     (approximate)   History   Abscess   HPI  Charles Henderson is a 28 y.o. male with no significant past medical history presents emergency department complaining of abscess on the left buttock.  Patient states symptoms for 2 days.  No fever or chills.  No drainage from the area.  States is very painful.  History of abscesses but none on the buttock area before.      Physical Exam   Triage Vital Signs: ED Triage Vitals [01/08/24 0923]  Encounter Vitals Group     BP (!) 155/108     Girls Systolic BP Percentile      Girls Diastolic BP Percentile      Boys Systolic BP Percentile      Boys Diastolic BP Percentile      Pulse Rate 100     Resp 18     Temp 97.9 F (36.6 C)     Temp Source Oral     SpO2 99 %     Weight 284 lb (128.8 kg)     Height 5' 9 (1.753 m)     Head Circumference      Peak Flow      Pain Score 9     Pain Loc      Pain Education      Exclude from Growth Chart     Most recent vital signs: Vitals:   01/08/24 0923 01/08/24 1010  BP: (!) 155/108   Pulse: 100   Resp: 18   Temp: 97.9 F (36.6 C)   SpO2: 99% 99%     General: Awake, no distress.   CV:  Good peripheral perfusion. Resp:  Normal effort. Abd:  No distention.   Other:  Skin on the left gluteal cleft with large abscess noted, area is very fluctuant, does not appear to involve the anal ring, does not appear to be a rectal abscess but more just a cutaneous abscess.   ED Results / Procedures / Treatments   Labs (all labs ordered are listed, but only abnormal results are displayed) Labs Reviewed  CBC WITH DIFFERENTIAL/PLATELET - Abnormal; Notable for the following components:      Result Value   WBC 13.1 (*)    Hemoglobin 12.2 (*)    HCT 38.0 (*)    Neutro Abs 10.7 (*)    Monocytes Absolute 1.3 (*)    All other components within normal limits  BASIC  METABOLIC PANEL WITH GFR     EKG     RADIOLOGY     PROCEDURES:   .Incision and Drainage  Date/Time: 01/08/2024 2:08 PM  Performed by: Gasper Devere ORN, PA-C Authorized by: Gasper Devere ORN, PA-C   Consent:    Consent obtained:  Verbal   Consent given by:  Patient   Risks discussed:  Bleeding, incomplete drainage, pain and damage to other organs   Alternatives discussed:  No treatment Universal protocol:    Procedure explained and questions answered to patient or proxy's satisfaction: yes     Relevant documents present and verified: yes     Test results available : yes     Imaging studies available: yes     Required blood products, implants, devices, and special equipment available: yes     Site/side marked: yes     Immediately prior to procedure, a time out was called:  yes     Patient identity confirmed:  Verbally with patient Location:    Type:  Abscess   Location:  Anogenital   Anogenital location:  Gluteal cleft Pre-procedure details:    Skin preparation:  Chlorhexidine Sedation:    Sedation type:  None Anesthesia:    Anesthesia method:  Local infiltration and topical application   Topical anesthetic:  LET   Local anesthetic:  Lidocaine  1% w/o epi Procedure type:    Complexity:  Complex Procedure details:    Incision types:  Single straight and stab incision   Incision depth:  Subcutaneous   Wound management:  Probed and deloculated, irrigated with saline and extensive cleaning   Drainage:  Purulent and bloody   Drainage amount:  Copious   Wound treatment:  Drain placed   Packing materials:  1/4 in iodoform gauze Post-procedure details:    Procedure completion:  Tolerated well, no immediate complications   Critical Care:   Chief Complaint  Patient presents with   Abscess      MEDICATIONS ORDERED IN ED: Medications  lidocaine -EPINEPHrine -tetracaine  (LET) topical gel (has no administration in time range)  lidocaine  (PF) (XYLOCAINE ) 1 %  injection 5 mL (has no administration in time range)  iohexol  (OMNIPAQUE ) 300 MG/ML solution 100 mL (100 mLs Intravenous Contrast Given 01/08/24 1222)     IMPRESSION / MDM / ASSESSMENT AND PLAN / ED COURSE  I reviewed the triage vital signs and the nursing notes.                              Differential diagnosis includes, but is not limited to, abscess, fistula, Fournier's gangrene, cellulitis  Patient's presentation is most consistent with acute illness / injury with system symptoms.   Medications given: L ET, Xylocaine  1% without epi  Patient's labs are reassuring except white count is elevated at 13.1 indicating infection  Due to the size and extensive area involved we will do CT abdomen pelvis to ensure there is no tracking, Fournier's gangrene etc.  CT abdomen pelvis IV contrast, independent review interpretation by me as having a large abscess, radiologist comments it is 5.6 x 2.4 cm in the subcu, does not extend to the rectum or anus  I did explain everything to the patient.  See procedure note for abscess incision and drainage, incision and drainage performed by Ollis Laundry PA student under my supervision  Patient did tolerate procedure well.  Dressing applied.  He is to return emergency department in 2 days for wound recheck and packing removal.  Wound care discussed.  Follow-up with his regular doctor as needed.  After his wound check here over the weekend he can follow-up with Dr. Desiderio at surgery.  Patient is in agreement treatment plan.  Discharged stable condition.  He was given a prescription of doxycycline , Keflex , and pain medication        FINAL CLINICAL IMPRESSION(S) / ED DIAGNOSES   Final diagnoses:  Abscess     Rx / DC Orders   ED Discharge Orders          Ordered    doxycycline  (VIBRA -TABS) 100 MG tablet  2 times daily        01/08/24 1400    cephALEXin  (KEFLEX ) 500 MG capsule  3 times daily        01/08/24 1400    oxyCODONE -acetaminophen   (PERCOCET) 5-325 MG tablet  Every 4 hours PRN  01/08/24 1400             Note:  This document was prepared using Dragon voice recognition software and may include unintentional dictation errors.    Gasper Devere ORN, PA-C 01/08/24 1412    Claudene Rover, MD 01/08/24 1540
